# Patient Record
Sex: Male | Born: 1935 | Race: Black or African American | Hispanic: No | Marital: Married | Smoking: Former smoker
Health system: Southern US, Community
[De-identification: ages and names within clinical notes are randomized; demographics above are authoritative.]

## PROBLEM LIST (undated history)

## (undated) DIAGNOSIS — R519 Headache, unspecified: Secondary | ICD-10-CM

## (undated) DIAGNOSIS — G4730 Sleep apnea, unspecified: Secondary | ICD-10-CM

## (undated) DIAGNOSIS — T4145XA Adverse effect of unspecified anesthetic, initial encounter: Secondary | ICD-10-CM

## (undated) DIAGNOSIS — Z87442 Personal history of urinary calculi: Secondary | ICD-10-CM

## (undated) DIAGNOSIS — E119 Type 2 diabetes mellitus without complications: Secondary | ICD-10-CM

## (undated) DIAGNOSIS — M1990 Unspecified osteoarthritis, unspecified site: Secondary | ICD-10-CM

## (undated) DIAGNOSIS — N183 Chronic kidney disease, stage 3 unspecified: Secondary | ICD-10-CM

## (undated) DIAGNOSIS — T8859XA Other complications of anesthesia, initial encounter: Secondary | ICD-10-CM

## (undated) DIAGNOSIS — J189 Pneumonia, unspecified organism: Secondary | ICD-10-CM

## (undated) DIAGNOSIS — I10 Essential (primary) hypertension: Secondary | ICD-10-CM

## (undated) DIAGNOSIS — K219 Gastro-esophageal reflux disease without esophagitis: Secondary | ICD-10-CM

## (undated) HISTORY — DX: Essential (primary) hypertension: I10

## (undated) HISTORY — PX: EYE SURGERY: SHX253

## (undated) HISTORY — PX: DENTAL SURGERY: SHX609

## (undated) HISTORY — PX: TONSILLECTOMY: SUR1361

## (undated) HISTORY — DX: Type 2 diabetes mellitus without complications: E11.9

## (undated) HISTORY — PX: OTHER SURGICAL HISTORY: SHX169

---
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
## 1997-09-18 ENCOUNTER — Emergency Department (HOSPITAL_COMMUNITY): Admission: EM | Admit: 1997-09-18 | Discharge: 1997-09-18 | Payer: Self-pay | Admitting: Emergency Medicine

## 2001-03-04 ENCOUNTER — Ambulatory Visit (HOSPITAL_COMMUNITY): Admission: RE | Admit: 2001-03-04 | Discharge: 2001-03-04 | Payer: Self-pay | Admitting: Internal Medicine

## 2001-03-04 ENCOUNTER — Encounter: Payer: Self-pay | Admitting: Internal Medicine

## 2001-04-28 ENCOUNTER — Emergency Department (HOSPITAL_COMMUNITY): Admission: EM | Admit: 2001-04-28 | Discharge: 2001-04-28 | Payer: Self-pay | Admitting: Emergency Medicine

## 2001-04-28 ENCOUNTER — Encounter: Payer: Self-pay | Admitting: Emergency Medicine

## 2004-01-23 ENCOUNTER — Ambulatory Visit (HOSPITAL_COMMUNITY): Admission: RE | Admit: 2004-01-23 | Discharge: 2004-01-23 | Payer: Self-pay | Admitting: Gastroenterology

## 2009-11-20 ENCOUNTER — Ambulatory Visit (HOSPITAL_COMMUNITY): Admission: RE | Admit: 2009-11-20 | Discharge: 2009-11-20 | Payer: Self-pay | Admitting: Internal Medicine

## 2010-09-20 NOTE — Op Note (Signed)
NAMELOXLEY, MACLENNAN               ACCOUNT NO.:  [id number]   MEDICAL RECORD NO.:  [id number]          PATIENT TYPE:  AMB   LOCATION:  ENDO                         FACILITY:  [facility]   PHYSICIAN:  Tory Emerald. Benson Norway, MD    DATE OF BIRTH:  1935-06-03   DATE OF PROCEDURE:  01/23/2004  DATE OF DISCHARGE:                                 OPERATIVE REPORT   PROCEDURE:  Colonoscopy.   ENDOSCOPIST:  Tory Emerald. Benson Norway, M.D.   EQUIPMENT USED:  Adult Olympus colonoscope.   INDICATIONS FOR PROCEDURE:  Hematochezia.   PHYSICAL EXAMINATION:  CARDIOVASCULAR:  Regular rate and rhythm.  LUNGS:  Clear to auscultation bilaterally.  ABDOMEN:  Obese, soft, nontender and nondistended.   MEDICATIONS:  Fentanyl 75 mcg IV and Versed 7.5 mg IV.   CONSENT:  Informed consent was obtained from the patient after describing  the risks of bleeding, infection, perforation, medication reactions, a 10%  miss rate for small colon cancer and the risk of death; all of which are not  exclusive of any other complications that can occur.   DESCRIPTION OF PROCEDURE:  After the patient was placed in the left lateral  decubitus position and adequate sedation was achieved, a rectal examination  was performed and there was no evidence of any masses palpated or other  abnormalities.  The colonoscope was then inserted through the anus and  advanced to the cecum under direct visualization.  Photo documentation was  obtained of the cecum.  The prep was graded as being good and in certain  areas, if there were pools of fluid, this was aspirated to improve  visualization and washes as needed.  The terminal ileum was not able to be  intubated as there was marked looping of the colonoscope during this  procedure secondary to the patient's girth and length of colon.  Upon  withdrawal of the colonoscope, the ascending, transverse, descending and  sigmoid colons, there was no evidence of any masses, __________, erosions,  ulcerations,  polyps, or diverticula.  With inspection of the rectum, again  there were no abnormalities, however, on retroflexion the patient did  exhibit moderate external hemorrhoids.  No active bleeding noted at this  time.   PLAN:  1.  Repeat colonoscopy in 10 years.  2.  Maintain high fiber diet.     PDH/MEDQ  D:  01/23/2004  T:  01/24/2004  Job:  [id number]

## 2013-09-28 ENCOUNTER — Ambulatory Visit (HOSPITAL_BASED_OUTPATIENT_CLINIC_OR_DEPARTMENT_OTHER): Payer: Medicare HMO | Attending: Internal Medicine | Admitting: Radiology

## 2013-09-28 VITALS — Ht 71.0 in | Wt 246.0 lb

## 2013-09-28 DIAGNOSIS — G4733 Obstructive sleep apnea (adult) (pediatric): Secondary | ICD-10-CM | POA: Insufficient documentation

## 2013-09-30 NOTE — Addendum Note (Signed)
Addended by: Mercer Pod on: 09/30/2013 06:55 AM   Modules accepted: Level of Service

## 2013-10-02 DIAGNOSIS — G4730 Sleep apnea, unspecified: Secondary | ICD-10-CM

## 2013-10-02 DIAGNOSIS — G4710 Hypersomnia, unspecified: Secondary | ICD-10-CM

## 2013-10-02 NOTE — Sleep Study (Signed)
   NAME: Brandon Weaver DATE OF BIRTH:  11/09/1935 MEDICAL RECORD NUMBER [id number]  LOCATION: [facility]  PHYSICIAN: Devina Bezold D Vijay Durflinger  DATE OF STUDY: 09/28/2013  SLEEP STUDY TYPE: Nocturnal Polysomnogram               REFERRING PHYSICIAN: Foye Spurling, MD  INDICATION FOR STUDY: Hypersomnia with sleep apnea  EPWORTH SLEEPINESS SCORE:   9/24 HEIGHT: 5\' 11"  (180.3 cm)  WEIGHT: 246 lb (111.585 kg)    Body mass index is 34.33 kg/(m^2).  NECK SIZE: 19.5 in.  MEDICATIONS: Charted for review  SLEEP ARCHITECTURE: Total sleep time 246.5 minutes with sleep efficiency 69.5%. Stage I was 14%, stage II 70.2%, stage III absent, REM 15.8% of total sleep time. Sleep latency 81.5 minutes, REM latency 89.5 minutes, awake after sleep onset 9.5 minutes, arousal index 24.3, bedtime medication: None  RESPIRATORY DATA: Apnea hypopnea index (AHI) 57 per hour. 233 total events scored including 127 obstructive apneas, 1 central apnea, 2 mixed apneas, 103 hypopneas. All events were while sleeping supine. REM AHI 81.5 per hour. He did not have enough sleep in the first hours of the night to meet on this study.  OXYGEN DATA: Moderate snoring with oxygen desaturation to a nadir of 76% and mean oxygen saturation through the study of 90.3% on room air.  CARDIAC DATA: Sinus rhythm with frequent PACs throughout the night  MOVEMENT/PARASOMNIA: No significant movement disturbance, no bathroom trips  IMPRESSION/ RECOMMENDATION:   1) Severe obstructive sleep apnea/hypopnea syndrome, AHI 57 per hour  with supine events. Moderate snoring with oxygen desaturation to a nadir of 76% and mean oxygen saturation through the study of 90.3% on room air.  2) Delayed sleep onset at 12:15 AM, too late to permit split protocol CPAP titration on this study. This patient can be scheduled for a dedicated CPAP titration study if appropriate.  Signed Baird Lyons M.D. Flasher, Tax adviser  of Sleep Medicine  ELECTRONICALLY SIGNED ON:  10/02/2013, 4:28 PM [place] PH: [phone]   FX: [phone] [place]

## 2013-12-25 ENCOUNTER — Ambulatory Visit (HOSPITAL_BASED_OUTPATIENT_CLINIC_OR_DEPARTMENT_OTHER): Payer: Medicare HMO | Attending: Internal Medicine | Admitting: Radiology

## 2013-12-25 VITALS — Ht 70.0 in | Wt 245.0 lb

## 2013-12-25 DIAGNOSIS — G4733 Obstructive sleep apnea (adult) (pediatric): Secondary | ICD-10-CM

## 2013-12-31 DIAGNOSIS — G4733 Obstructive sleep apnea (adult) (pediatric): Secondary | ICD-10-CM

## 2013-12-31 NOTE — Sleep Study (Signed)
   NAME: Brandon Weaver DATE OF BIRTH:  12-Dec-1935 MEDICAL RECORD NUMBER [id number]  LOCATION: [facility]  PHYSICIAN: Emmett Bracknell D  DATE OF STUDY: 12/25/2013  SLEEP STUDY TYPE: Nocturnal Polysomnogram               REFERRING PHYSICIAN: Foye Spurling, MD  INDICATION FOR STUDY: Hypersomnia with sleep apnea-CPAP titration  EPWORTH SLEEPINESS SCORE:   8/24 HEIGHT: 5\' 10"  (177.8 cm)  WEIGHT: 245 lb (111.131 kg)    Body mass index is 35.15 kg/(m^2).  NECK SIZE: 19.5 in.  MEDICATIONS: Charted for review  SLEEP ARCHITECTURE: Total sleep time 299.5 minutes with sleep efficiency 83.8%. Stage I was 2.2%, stage II 50.1%, stage III absent, REM 47.7% of total sleep time. Sleep latency 35 minutes, REM latency 5.5 minutes, awake after sleep onset 23 minutes, arousal index 7.6, bedtime medication: None  RESPIRATORY DATA: CPAP was titrated to 19 CWP, AHI 0 per hour. He wore a large fullface mask.  OXYGEN DATA: Snoring was prevented and mean oxygen saturation was 94.3% on room air with CPAP  CARDIAC DATA: Sinus rhythm with PACs  MOVEMENT/PARASOMNIA: No significant movement disturbance, no bathroom trips  IMPRESSION/ RECOMMENDATION:   1) Successful CPAP titration to 19 CWP, AHI 0 per hour. He wore a large [employer] fullface mask with heated humidifier. Snoring was prevented and mean oxygen saturation was 94.3% on room air. 2) baseline polysomnogram on 09/28/2013 recorded AHI 57 per hour with body weight 246 pounds for that study.   Deneise Lever Diplomate, American Board of Sleep Medicine  ELECTRONICALLY SIGNED ON:  12/31/2013, 9:51 AM [place] PH: [phone]   FX: [phone] [place]

## 2014-01-17 ENCOUNTER — Telehealth: Payer: Self-pay | Admitting: Internal Medicine

## 2014-01-17 NOTE — Telephone Encounter (Signed)
Spoke with Dr Rosezena Sensor was okay with patient seeing [facility] on 03-01-14 at 4pm for sleep consult. I gave Dr Carlis Abbott the phone number and address to the office to give the patient. He will also let patient know that he can call our office anytime to see if any sooner appts are available.

## 2014-01-20 ENCOUNTER — Encounter (HOSPITAL_BASED_OUTPATIENT_CLINIC_OR_DEPARTMENT_OTHER): Payer: Medicare Other

## 2014-03-01 ENCOUNTER — Ambulatory Visit (INDEPENDENT_AMBULATORY_CARE_PROVIDER_SITE_OTHER): Payer: Medicare HMO | Admitting: Pulmonary Disease

## 2014-03-01 ENCOUNTER — Encounter: Payer: Self-pay | Admitting: Pulmonary Disease

## 2014-03-01 VITALS — BP 140/72 | HR 73 | Temp 98.8°F | Ht 70.0 in | Wt 258.0 lb

## 2014-03-01 DIAGNOSIS — G4733 Obstructive sleep apnea (adult) (pediatric): Secondary | ICD-10-CM

## 2014-03-01 NOTE — Assessment & Plan Note (Signed)
The pt has severe obstructive sleep apnea, and will need aggressive treatment with cpap while he is working on weight loss.  I have had a long discussion with him about sleep apnea, including its impact to his quality of life and cardiovascular health. I will see him back in 8 weeks to check on his progress, and he has been encouraged to call us if he is having issues with tolerance. I will set the patient up on cpap at a moderate pressure level to allow for desensitization, and will troubleshoot the device over the next 4-6weeks if needed.  The pt is to call me if having issues with tolerance.  Will then optimize the pressure once patient is able to wear cpap on a consistent basis.

## 2014-03-01 NOTE — Patient Instructions (Signed)
Will start on cpap at a moderate pressure level.  Please call if having any tolerance issues. Work on weight loss followup with me in 8 weeks.

## 2014-03-01 NOTE — Progress Notes (Signed)
Subjective:    Patient ID: Brandon Weaver, male    DOB: [birth year], 78 y.o.   MRN: [id number]  HPI The patient is a 78 year old male who I have been asked to see for management of obstructive sleep apnea. He underwent a sleep study earlier this year and was found to have severe OSA, with an AHI of 57 events per hour. He returned for a C-peptide titration study, and was found to have an optimal pressure of 19 cm of water pressure. The patient has been told by his spouse that he has very loud snoring as well as an abnormal breathing pattern during sleep. He has frequent awakenings at night, and is not rested in the mornings upon arising. He will fall asleep with any period of inactivity during the day, also doze in the evening while watching television.  This will lead to difficulties establishing sleep at night because he had slept so much during the day. He denies any issues with sleepiness while driving. The patient states that his weight is been stable over the last 2 years, and his Epworth score today is 7.   Sleep Questionnaire What time do you typically go to bed?( Between what hours) 1:00- pt naps often throughout day when schedule permits.  1:00- pt naps often throughout day when schedule permits.  at 1558 on 03/01/14 by Len Blalock, CMA How long does it take you to fall asleep? 1-3 hours 1-3 hours at 1558 on 03/01/14 by Len Blalock, CMA How many times during the night do you wake up? 2 2 at 1558 on 03/01/14 by Len Blalock, CMA What time do you get out of bed to start your day? 0600 0600 at 1558 on 03/01/14 by Len Blalock, CMA Do you drive or operate heavy machinery in your occupation? No No at 1558 on 03/01/14 by Len Blalock, CMA How much has your weight changed (up or down) over the past two years? (In pounds) 0 oz (0 kg) 0 oz (0 kg) at 1558 on 03/01/14 by Len Blalock, CMA Have you ever had a sleep study before? Yes Yes at 1558 on  03/01/14 by Len Blalock, CMA If yes, location of study? [place] at 1558 on 03/01/14 by Len Blalock, CMA If yes, date of study? 2015 2015 at 1558 on 03/01/14 by Len Blalock, CMA Do you currently use CPAP? No No at 1558 on 03/01/14 by Len Blalock, CMA Do you wear oxygen at any time? No No at 1558 on 03/01/14 by Len Blalock, CMA   Review of Systems  Constitutional: Negative for fever and unexpected weight change.  HENT: Negative for congestion, dental problem, ear pain, nosebleeds, postnasal drip, rhinorrhea, sinus pressure, sneezing, sore throat and trouble swallowing.   Eyes: Negative for redness and itching.  Respiratory: Positive for apnea. Negative for cough, chest tightness, shortness of breath and wheezing.   Cardiovascular: Negative for palpitations and leg swelling.  Gastrointestinal: Negative for nausea and vomiting.  Genitourinary: Negative for dysuria.  Musculoskeletal: Negative for joint swelling.  Skin: Negative for rash.  Neurological: Negative for headaches.  Hematological: Does not bruise/bleed easily.  Psychiatric/Behavioral: Negative for dysphoric mood. The patient is not nervous/anxious.        Objective:   Physical Exam Constitutional:  Obese male, no acute distress  HENT:  Nares patent without discharge  Oropharynx without exudate, palate and uvula are very elongated and thick  Eyes:  Perrla, eomi,  no scleral icterus  Neck:  No JVD, no TMG  Cardiovascular:  Normal rate, regular rhythm, no rubs or gallops.  No murmurs        Intact distal pulses  Pulmonary :  Normal breath sounds, no stridor or respiratory distress   No rales, rhonchi, or wheezing  Abdominal:  Soft, nondistended, bowel sounds present.  No tenderness noted.   Musculoskeletal:  mild lower extremity edema noted.  Lymph Nodes:  No cervical lymphadenopathy noted  Skin:  No cyanosis noted  Neurologic:  Alert, appropriate, moves all  4 extremities without obvious deficit.         Assessment & Plan:

## 2014-04-26 ENCOUNTER — Ambulatory Visit (INDEPENDENT_AMBULATORY_CARE_PROVIDER_SITE_OTHER): Payer: Medicare HMO | Admitting: Pulmonary Disease

## 2014-04-26 ENCOUNTER — Encounter: Payer: Self-pay | Admitting: Pulmonary Disease

## 2014-04-26 VITALS — BP 134/76 | HR 76 | Temp 97.8°F | Ht 70.0 in | Wt 253.4 lb

## 2014-04-26 DIAGNOSIS — G4733 Obstructive sleep apnea (adult) (pediatric): Secondary | ICD-10-CM

## 2014-04-26 NOTE — Patient Instructions (Signed)
Please work with your home care company on a better mask fit if you continue to have leaks. Will make adjustments to your pressure.  Your home care company can do this wirelessly.  Work on weight loss followup with me in 73mos

## 2014-04-26 NOTE — Assessment & Plan Note (Signed)
The patient has been noncompliant with his C Pap since the last visit, and blames this on having a cold that he just got over. Dress to him the severity of his sleep apnea, and how it is potentially life-threatening over time.  I have asked him to get back on his device, and to let us know if he is having any issues with tolerance. He was having some mask leak issues, and I've asked him to contact his home care company to work on a better fitting mask. I will extend his pressure range on the automatic setting, and have encouraged him to work aggressively on weight loss.

## 2014-04-26 NOTE — Progress Notes (Signed)
   Subjective:    Patient ID: Brandon Weaver, male    DOB: Aug 31, 1935, 78 y.o.   MRN: [id number]  HPI The patient comes in today for follow-up of his obstructive sleep apnea. Started on CPAP at the last visit, but unfortunately has only worn his device 1 day since that time. He tells me that he developed a cold, and had a runny nose that interfered with using the C Pap device. He is now getting back on the device without issue. He is having some mask leak, but does not want to over tighten the straps. I have asked him to get back with his home care company to get this adjusted.   Review of Systems  Constitutional: Negative for fever and unexpected weight change.  HENT: Negative for congestion, dental problem, ear pain, nosebleeds, postnasal drip, rhinorrhea, sinus pressure, sneezing, sore throat and trouble swallowing.   Eyes: Negative for redness and itching.  Respiratory: Negative for cough, chest tightness, shortness of breath and wheezing.   Cardiovascular: Negative for palpitations and leg swelling.  Gastrointestinal: Negative for nausea and vomiting.  Genitourinary: Negative for dysuria.  Musculoskeletal: Negative for joint swelling.  Skin: Negative for rash.  Neurological: Negative for headaches.  Hematological: Does not bruise/bleed easily.  Psychiatric/Behavioral: Negative for dysphoric mood. The patient is not nervous/anxious.        Objective:   Physical Exam Overweight male in no acute distress Nose without purulence or discharge noted No skin breakdown or pressure necrosis from the C Pap mask Neck without lymphadenopathy or thyromegaly Lower extremities with mild edema, no cyanosis Alert and oriented, moves all 4 extremities.       Assessment & Plan:

## 2015-06-14 DIAGNOSIS — E119 Type 2 diabetes mellitus without complications: Secondary | ICD-10-CM | POA: Diagnosis not present

## 2015-06-14 DIAGNOSIS — I10 Essential (primary) hypertension: Secondary | ICD-10-CM | POA: Diagnosis not present

## 2015-06-14 DIAGNOSIS — E7800 Pure hypercholesterolemia, unspecified: Secondary | ICD-10-CM | POA: Diagnosis not present

## 2015-07-10 DIAGNOSIS — I10 Essential (primary) hypertension: Secondary | ICD-10-CM | POA: Diagnosis not present

## 2015-07-10 DIAGNOSIS — E119 Type 2 diabetes mellitus without complications: Secondary | ICD-10-CM | POA: Diagnosis not present

## 2015-07-10 DIAGNOSIS — E7800 Pure hypercholesterolemia, unspecified: Secondary | ICD-10-CM | POA: Diagnosis not present

## 2015-07-10 DIAGNOSIS — J40 Bronchitis, not specified as acute or chronic: Secondary | ICD-10-CM | POA: Diagnosis not present

## 2015-10-10 DIAGNOSIS — E1165 Type 2 diabetes mellitus with hyperglycemia: Secondary | ICD-10-CM | POA: Diagnosis not present

## 2015-10-10 DIAGNOSIS — E119 Type 2 diabetes mellitus without complications: Secondary | ICD-10-CM | POA: Diagnosis not present

## 2015-10-10 DIAGNOSIS — N400 Enlarged prostate without lower urinary tract symptoms: Secondary | ICD-10-CM | POA: Diagnosis not present

## 2015-10-10 DIAGNOSIS — E7800 Pure hypercholesterolemia, unspecified: Secondary | ICD-10-CM | POA: Diagnosis not present

## 2015-10-10 DIAGNOSIS — R945 Abnormal results of liver function studies: Secondary | ICD-10-CM | POA: Diagnosis not present

## 2015-10-10 DIAGNOSIS — I10 Essential (primary) hypertension: Secondary | ICD-10-CM | POA: Diagnosis not present

## 2015-10-10 DIAGNOSIS — E1122 Type 2 diabetes mellitus with diabetic chronic kidney disease: Secondary | ICD-10-CM | POA: Diagnosis not present

## 2015-10-31 ENCOUNTER — Encounter (HOSPITAL_COMMUNITY): Payer: Self-pay | Admitting: Emergency Medicine

## 2015-10-31 ENCOUNTER — Ambulatory Visit (HOSPITAL_COMMUNITY)
Admission: EM | Admit: 2015-10-31 | Discharge: 2015-10-31 | Disposition: A | Discharge status: Home / Self Care | Payer: Medicare Other | Attending: Family Medicine | Admitting: Family Medicine

## 2015-10-31 DIAGNOSIS — T63304A Toxic effect of unspecified spider venom, undetermined, initial encounter: Secondary | ICD-10-CM

## 2015-10-31 DIAGNOSIS — L03818 Cellulitis of other sites: Secondary | ICD-10-CM | POA: Diagnosis not present

## 2015-10-31 MED ORDER — DOXYCYCLINE HYCLATE 100 MG PO CAPS: 100.0000 mg | ORAL_CAPSULE | Freq: Two times a day (BID) | ORAL | Status: DC

## 2015-10-31 NOTE — ED Provider Notes (Signed)
CSN: [id number]     Arrival date & time 10/31/15  1418 History   First MD Initiated Contact with Patient 10/31/15 1521     Chief Complaint  Patient presents with  . Insect Bite   (Consider location/radiation/quality/duration/timing/severity/associated sxs/prior Treatment) Patient is a 80 y.o. male presenting with rash.  Rash Location:  Leg Leg rash location:  L lower leg and R lower leg Quality: redness   Onset quality:  Sudden Duration:  1 week Timing:  Constant Progression:  Spreading Context: insect bite/sting   Relieved by:  Nothing Worsened by:  Nothing tried   Past Medical History  Diagnosis Date  . Diabetes ([place])   . Hypertension    Past Surgical History  Procedure Laterality Date  . Reflux surgery    . L shoulder surgery      muscle repair  . Tonsillectomy     Family History  Problem Relation Age of Onset  . COPD Brother   . COPD Mother   . Heart disease Mother   . Sleep apnea Son   . Cancer - Prostate Brother    Social History  Substance Use Topics  . Smoking status: Former Smoker -- 0.50 packs/day for 30 years    Types: Cigarettes    Quit date: 05/05/1986  . Smokeless tobacco: Never Used  . Alcohol Use: None    Review of Systems  Constitutional: Negative.   HENT: Negative.   Eyes: Negative.   Respiratory: Negative.   Cardiovascular: Negative.   Gastrointestinal: Negative.   Endocrine: Negative.   Genitourinary: Negative.   Skin: Positive for rash.  Allergic/Immunologic: Negative.   Neurological: Negative.   Hematological: Negative.   Psychiatric/Behavioral: Negative.     Allergies  Review of patient's allergies indicates no known allergies.  Home Medications   Prior to Admission medications   Medication Sig Start Date End Date Taking? Authorizing Provider  amLODipine (NORVASC) 10 MG tablet Take 10 mg by mouth daily.   Yes Historical Provider, MD  insulin aspart protamine- aspart (NOVOLOG MIX 70/30) (70-30) 100 UNIT/ML injection  Inject 45 Units into the skin 2 (two) times daily with a meal.   Yes Historical Provider, MD  irbesartan (AVAPRO) 300 MG tablet Take 300 mg by mouth daily.   Yes Historical Provider, MD  doxycycline (VIBRAMYCIN) 100 MG capsule Take 1 capsule (100 mg total) by mouth 2 (two) times daily. 10/31/15   Lysbeth Penner, FNP   Meds Ordered and Administered this Visit  Medications - No data to display  BP 138/85 mmHg  Pulse 79  Temp(Src) 98.8 F (37.1 C) (Oral)  Resp 12  SpO2 95% No data found.   Physical Exam  Constitutional: He appears well-developed and well-nourished.  HENT:  Head: Normocephalic.  Right Ear: External ear normal.  Left Ear: External ear normal.  Mouth/Throat: Oropharynx is clear and moist.  Eyes: Conjunctivae are normal. Pupils are equal, round, and reactive to light.  Neck: Normal range of motion. Neck supple.  Cardiovascular: Normal rate, regular rhythm and normal heart sounds.   Pulmonary/Chest: Effort normal and breath sounds normal.  Skin: Rash noted.  Left lower leg with erythema and swelling with central puncture approx 4cm and right  Leg with erythema and swelling and central puncture wound with no drainage approx 3 cm diameter.    ED Course  Procedures (including critical care time)  Labs Review Labs Reviewed - No data to display  Imaging Review No results found.   Visual Acuity Review  Right Eye  Distance:   Left Eye Distance:   Bilateral Distance:    Right Eye Near:   Left Eye Near:    Bilateral Near:         MDM   1. Spider bite, undetermined intent, initial encounter   2. Cellulitis of other specified site    Doxycycline 100mg  one po bid x 10 days #20     Lysbeth Penner, FNP 10/31/15 1635

## 2015-10-31 NOTE — Discharge Instructions (Signed)
Take Benadryl 25mg  one to two po q 6 hours prn itching or rash.  May cause sleepiness or somnolence so may want to take at night time during hour of sleep.  Follow up if not better prn

## 2015-10-31 NOTE — ED Notes (Signed)
The patient presented to the [facility] with a complaint of a possible insect bite on his lower left leg that occurred a week ago. The patient stated that it is now red and swollen.

## 2015-11-08 DIAGNOSIS — Z0001 Encounter for general adult medical examination with abnormal findings: Secondary | ICD-10-CM | POA: Diagnosis not present

## 2015-12-27 DIAGNOSIS — H40023 Open angle with borderline findings, high risk, bilateral: Secondary | ICD-10-CM | POA: Diagnosis not present

## 2015-12-27 DIAGNOSIS — E119 Type 2 diabetes mellitus without complications: Secondary | ICD-10-CM | POA: Diagnosis not present

## 2015-12-27 DIAGNOSIS — H04123 Dry eye syndrome of bilateral lacrimal glands: Secondary | ICD-10-CM | POA: Diagnosis not present

## 2015-12-27 DIAGNOSIS — H40033 Anatomical narrow angle, bilateral: Secondary | ICD-10-CM | POA: Diagnosis not present

## 2016-02-13 DIAGNOSIS — N400 Enlarged prostate without lower urinary tract symptoms: Secondary | ICD-10-CM | POA: Diagnosis not present

## 2016-02-13 DIAGNOSIS — E7800 Pure hypercholesterolemia, unspecified: Secondary | ICD-10-CM | POA: Diagnosis not present

## 2016-02-13 DIAGNOSIS — I10 Essential (primary) hypertension: Secondary | ICD-10-CM | POA: Diagnosis not present

## 2016-02-13 DIAGNOSIS — E1165 Type 2 diabetes mellitus with hyperglycemia: Secondary | ICD-10-CM | POA: Diagnosis not present

## 2016-02-20 DIAGNOSIS — R350 Frequency of micturition: Secondary | ICD-10-CM | POA: Diagnosis not present

## 2016-02-20 DIAGNOSIS — R972 Elevated prostate specific antigen [PSA]: Secondary | ICD-10-CM | POA: Diagnosis not present

## 2016-08-05 ENCOUNTER — Ambulatory Visit (HOSPITAL_COMMUNITY)
Admission: RE | Admit: 2016-08-05 | Discharge: 2016-08-05 | Disposition: A | Discharge status: Home / Self Care | Payer: Medicare Other | Source: Ambulatory Visit | Attending: Internal Medicine | Admitting: Internal Medicine

## 2016-08-05 ENCOUNTER — Other Ambulatory Visit: Payer: Self-pay | Admitting: Internal Medicine

## 2016-08-05 DIAGNOSIS — R042 Hemoptysis: Secondary | ICD-10-CM | POA: Insufficient documentation

## 2016-08-05 DIAGNOSIS — I700 Atherosclerosis of aorta: Secondary | ICD-10-CM | POA: Diagnosis not present

## 2017-01-12 ENCOUNTER — Ambulatory Visit (HOSPITAL_COMMUNITY)
Admission: EM | Admit: 2017-01-12 | Discharge: 2017-01-12 | Disposition: A | Discharge status: Home / Self Care | Payer: Medicare Other | Attending: Family Medicine | Admitting: Family Medicine

## 2017-01-12 ENCOUNTER — Encounter (HOSPITAL_COMMUNITY): Payer: Self-pay | Admitting: Emergency Medicine

## 2017-01-12 DIAGNOSIS — M5489 Other dorsalgia: Secondary | ICD-10-CM

## 2017-01-12 DIAGNOSIS — M546 Pain in thoracic spine: Secondary | ICD-10-CM

## 2017-01-12 DIAGNOSIS — R6883 Chills (without fever): Secondary | ICD-10-CM | POA: Diagnosis not present

## 2017-01-12 NOTE — Discharge Instructions (Signed)
It was nice meeting you today Mr. Ohalloran!  Your chills were likely due to a virus. Antibiotics do not treat viruses, and they go away on their own.   Your body aches and muscle pains are likely due to overexertion after helping the man out of the chair. You can take ibuprofen or Tylenol as needed for these pains.   If your cough gets worse, please let your primary doctor know.   Be well,  Dr. Avon Gully

## 2017-01-12 NOTE — ED Triage Notes (Signed)
PT reports he couldn't get out of bed Friday. PT reports he had bodyaches, chills, and pain all over.

## 2017-01-12 NOTE — ED Provider Notes (Signed)
Brandon Weaver    CSN: [id number] Arrival date & time: 01/12/17  1020   History   Chief Complaint Chief Complaint  Patient presents with  . Influenza    HPI SENDER RUEB is a 81 y.o. male.   HPI  Patient presents with body aches and chills.  Both symptoms began Friday morning. Said his body hurt so badly that he was unable to get out of bed. Pain located in back below L scapula and also in L groin. The day before, he tried to help lift a man out of a chair, and wonders if the pain may be related to that. He has not taken any medication to help with the pain. Soreness has improved since then but is still present.  Developed chills on Friday morning as well. Alternated between feeling very cold then very hot. Does not think he had a fever but has not measured his temperature. Denies sore throat, nasal congestion, vomiting, diarrhea. Has had normal appetite. No sick contacts. Denies SOB or chest pain. Has had a productive cough for a few months now but has not worsened recently.    Past Medical History:  Diagnosis Date  . Diabetes ([place])   . Hypertension     Patient Active Problem List   Diagnosis Date Noted  . OSA (obstructive sleep apnea) 03/01/2014    Past Surgical History:  Procedure Laterality Date  . L shoulder surgery     muscle repair  . reflux surgery    . TONSILLECTOMY        Home Medications    Prior to Admission medications   Medication Sig Start Date End Date Taking? Authorizing Provider  amLODipine (NORVASC) 10 MG tablet Take 10 mg by mouth daily.    [provider]  doxycycline (VIBRAMYCIN) 100 MG capsule Take 1 capsule (100 mg total) by mouth 2 (two) times daily. 10/31/15   Lysbeth Penner, FNP  insulin aspart protamine- aspart (NOVOLOG MIX 70/30) (70-30) 100 UNIT/ML injection Inject 45 Units into the skin 2 (two) times daily with a meal.    [provider]  irbesartan (AVAPRO) 300 MG tablet Take 300 mg by mouth daily.     [provider]    Family History Family History  Problem Relation Age of Onset  . COPD Brother   . COPD Mother   . Heart disease Mother   . Sleep apnea Son   . Cancer - Prostate Brother     Social History Social History  Substance Use Topics  . Smoking status: Former Smoker    Packs/day: 0.50    Years: 30.00    Types: Cigarettes    Quit date: 05/05/1986  . Smokeless tobacco: Never Used  . Alcohol use Not on file     Allergies   Patient has no known allergies.   Review of Systems Review of Systems  Constitutional: Positive for chills. Negative for appetite change and fever.  HENT: Negative for congestion, sneezing and sore throat.   Respiratory: Positive for cough. Negative for shortness of breath and wheezing.   Cardiovascular: Negative for chest pain.  Gastrointestinal: Negative for diarrhea, nausea and vomiting.  Musculoskeletal: Positive for back pain (Under L scapula).    Physical Exam Triage Vital Signs ED Triage Vitals  Enc Vitals Group     BP 01/12/17 1136 (!) 147/78     Pulse Rate 01/12/17 1136 91     Resp 01/12/17 1136 16     Temp 01/12/17 1136 98.9  F (37.2 C)     Temp Source 01/12/17 1136 Oral     SpO2 01/12/17 1136 95 %     Weight 01/12/17 1137 250 lb (113.4 kg)     Height 01/12/17 1137 5\' 10"  (1.778 m)     Head Circumference --      Peak Flow --      Pain Score 01/12/17 1137 7     Pain Loc --      Pain Edu? --      Excl. in [place]? --    No data found.   Updated Vital Signs BP (!) 147/78   Pulse 91   Temp 98.9 F (37.2 C) (Oral)   Resp 16   Ht 5\' 10"  (1.778 m)   Wt 250 lb (113.4 kg)   SpO2 95%   BMI 35.87 kg/m   Physical Exam  Constitutional: He is oriented to person, place, and time. He appears well-developed and well-nourished. No distress.  HENT:  Head: Normocephalic and atraumatic.  Nose: Nose normal.  Mouth/Throat: Oropharynx is clear and moist. No oropharyngeal exudate.  Eyes: Pupils are equal, round, and reactive  to light. Conjunctivae and EOM are normal. Right eye exhibits no discharge. Left eye exhibits no discharge.  Neck: Normal range of motion. Neck supple.  Cardiovascular: Normal rate, regular rhythm and normal heart sounds.   No murmur heard. Pulmonary/Chest: Effort normal and breath sounds normal. No respiratory distress. He has no wheezes.  Abdominal: Soft. Bowel sounds are normal. He exhibits no distension. There is no tenderness.  Musculoskeletal:  No TTP of back, L flank, L abdominal quadrants, or groin area, however patient can pinpoint location of pain in these specific areas.   Neurological: He is alert and oriented to person, place, and time.  Skin: Skin is warm and dry.  Psychiatric: He has a normal mood and affect. His behavior is normal.    UC Treatments / Results  Labs (all labs ordered are listed, but only abnormal results are displayed) Labs Reviewed - No data to display  EKG  EKG Interpretation None       Radiology No results found.  Procedures Procedures (including critical care time)  Medications Ordered in UC Medications - No data to display   Initial Impression / Assessment and Plan / UC Course  I have reviewed the triage vital signs and the nursing notes.  Pertinent labs & imaging results that were available during my care of the patient were reviewed by me and considered in my medical decision making (see chart for details).     Patient presenting with back and groin pain as well as chills. Suspect back and groin pain likely MSK in etiology related to helping large man out of a chair. Patient does not typically lift heavy things, and likely over exerted himself. Suspect will improve with ibuprofen or Tylenol, and encouraged patient to begin taking this as needed. Has already started to improve without medication at all. Etiology of chills less clear, however likely viral. No other accompanying symptoms of typical viral illness, including no URI symptoms  and no diarrhea or vomiting. As has now resolved, will not proceed with further workup. Patient does have history of chronic productive cough, however no coughing during encounter today and lungs CTAB. Also denies recent worsening of symptoms and denies SOB. Encouraged to f/u with his PCP if cough worsen.   Final Clinical Impressions(s) / UC Diagnoses   Final diagnoses:  Chills (without fever)  Acute left-sided thoracic back  pain    New Prescriptions New Prescriptions   No medications on file   Adin Hector, MD, MPH PGY-3     Verner Mould, MD 01/12/17 1201

## 2017-01-17 ENCOUNTER — Encounter (HOSPITAL_COMMUNITY): Payer: Self-pay | Admitting: Emergency Medicine

## 2017-01-17 ENCOUNTER — Emergency Department (HOSPITAL_COMMUNITY)
Admission: EM | Admit: 2017-01-17 | Discharge: 2017-01-17 | Disposition: A | Discharge status: Home / Self Care | Payer: Medicare Other | Attending: Emergency Medicine | Admitting: Emergency Medicine

## 2017-01-17 ENCOUNTER — Emergency Department (HOSPITAL_COMMUNITY): Payer: Medicare Other

## 2017-01-17 DIAGNOSIS — Z87891 Personal history of nicotine dependence: Secondary | ICD-10-CM | POA: Diagnosis not present

## 2017-01-17 DIAGNOSIS — M79605 Pain in left leg: Secondary | ICD-10-CM

## 2017-01-17 DIAGNOSIS — M545 Low back pain: Secondary | ICD-10-CM | POA: Diagnosis not present

## 2017-01-17 DIAGNOSIS — I10 Essential (primary) hypertension: Secondary | ICD-10-CM | POA: Diagnosis not present

## 2017-01-17 DIAGNOSIS — Z794 Long term (current) use of insulin: Secondary | ICD-10-CM | POA: Insufficient documentation

## 2017-01-17 DIAGNOSIS — E119 Type 2 diabetes mellitus without complications: Secondary | ICD-10-CM | POA: Insufficient documentation

## 2017-01-17 DIAGNOSIS — Z79899 Other long term (current) drug therapy: Secondary | ICD-10-CM | POA: Insufficient documentation

## 2017-01-17 MED ORDER — NAPROXEN 500 MG PO TABS: 500.0000 mg | ORAL_TABLET | Freq: Two times a day (BID) | ORAL | 0 refills | Status: DC

## 2017-01-17 MED ORDER — METHOCARBAMOL 500 MG PO TABS
500.0000 mg | ORAL_TABLET | Freq: Once | ORAL | Status: AC
  Administered 2017-01-17: 500 mg via ORAL
  Filled 2017-01-17: qty 1

## 2017-01-17 MED ORDER — NAPROXEN 250 MG PO TABS
500.0000 mg | ORAL_TABLET | Freq: Once | ORAL | Status: AC
  Administered 2017-01-17: 500 mg via ORAL
  Filled 2017-01-17: qty 2

## 2017-01-17 MED ORDER — METHOCARBAMOL 500 MG PO TABS: 500.0000 mg | ORAL_TABLET | Freq: Every evening | ORAL | 0 refills | Status: DC | PRN

## 2017-01-17 NOTE — ED Provider Notes (Signed)
[facility] DEPT Provider Note   CSN: [id number] Arrival date & time: 01/17/17  1045     History   Chief Complaint Chief Complaint  Patient presents with  . Back Pain  . Knee Pain    HPI Brandon Weaver is a 81 y.o. male who presents with left lower back pain and left leg pain. PMH significant for Type 2 DM, HTN, obesity. He states that about 9 days ago he was at his doctor's office and helped a 300+ pound man get out of a chair. He immediately felt pain in his back. The next day he also had chills and sweats so thought that he may have pneumonia or the flu. He went to [facility] and they diagnosed him with a muscle strain. Since then he has had persistent low back pain and improving left thoracic back pain. He also has pain in his left leg down to the knee and sometimes to the ankle and foot which are swollen. No fever, syncope, loss of bowel/bladder function, saddle anesthesia, urinary retention. He has been able to walk but is in a lot of pain. He has been taking Ibuprofen with minimal relief.    HPI  Past Medical History:  Diagnosis Date  . Diabetes ([place])   . Hypertension     Patient Active Problem List   Diagnosis Date Noted  . OSA (obstructive sleep apnea) 03/01/2014    Past Surgical History:  Procedure Laterality Date  . L shoulder surgery     muscle repair  . reflux surgery    . TONSILLECTOMY         Home Medications    Prior to Admission medications   Medication Sig Start Date End Date Taking? Authorizing Provider  amLODipine (NORVASC) 10 MG tablet Take 10 mg by mouth daily.    [provider]  doxycycline (VIBRAMYCIN) 100 MG capsule Take 1 capsule (100 mg total) by mouth 2 (two) times daily. 10/31/15   Lysbeth Penner, FNP  insulin aspart protamine- aspart (NOVOLOG MIX 70/30) (70-30) 100 UNIT/ML injection Inject 45 Units into the skin 2 (two) times daily with a meal.    [provider]  irbesartan (AVAPRO) 300 MG tablet Take 300 mg by mouth  daily.    [provider]    Family History Family History  Problem Relation Age of Onset  . COPD Brother   . COPD Mother   . Heart disease Mother   . Sleep apnea Son   . Cancer - Prostate Brother     Social History Social History  Substance Use Topics  . Smoking status: Former Smoker    Packs/day: 0.50    Years: 30.00    Types: Cigarettes    Quit date: 05/05/1986  . Smokeless tobacco: Never Used  . Alcohol use Not on file     Allergies   Patient has no known allergies.   Review of Systems Review of Systems  Constitutional: Negative for fever.  Musculoskeletal: Positive for back pain, gait problem, joint swelling and myalgias. Negative for arthralgias and neck pain.  Skin: Negative for wound.  Neurological: Negative for weakness and numbness.     Physical Exam Updated Vital Signs BP 130/73   Pulse 69   Temp 97.8 F (36.6 C)   Resp 18   SpO2 96%   Physical Exam  Constitutional: He is oriented to person, place, and time. He appears well-developed and well-nourished. No distress.  Overweight male in NAD  HENT:  Head: Normocephalic and  atraumatic.  Eyes: Pupils are equal, round, and reactive to light. Conjunctivae are normal. Right eye exhibits no discharge. Left eye exhibits no discharge. No scleral icterus.  Neck: Normal range of motion.  Cardiovascular: Normal rate.   Pulmonary/Chest: Effort normal. No respiratory distress.  Abdominal: He exhibits no distension.  Musculoskeletal:  Back: Inspection: No masses, deformity, or rash Palpation: Lumbar midline spinal tenderness. No paraspinal muscle tenderness. Pain is elicited with ROM Strength: 5/5 in lower extremities and normal plantar and dorsiflexion Sensation: Intact sensation with light touch in lower extremities bilaterally SLR: Positive seated straight leg raise on the left Gait: Antalgic gait   Neurological: He is alert and oriented to person, place, and time.  Skin: Skin is warm and dry.    Psychiatric: He has a normal mood and affect. His behavior is normal.  Nursing note and vitals reviewed.    ED Treatments / Results  Labs (all labs ordered are listed, but only abnormal results are displayed) Labs Reviewed - No data to display  EKG  EKG Interpretation None       Radiology Dg Lumbar Spine Complete  Result Date: 01/17/2017 CLINICAL DATA:  Injured back while lifting. EXAM: LUMBAR SPINE - COMPLETE 4+ VIEW COMPARISON:  None. FINDINGS: There are 5 non rib-bearing lumbar type vertebral bodies. Normal alignment of lumbar spine. No anterolisthesis or retrolisthesis. No definite pars defects. Lumbar vertebral body heights are preserved. Mild to moderate multilevel lumbar spine DDD, potentially worse at L2-L3 with disc space height loss, endplate irregularity and sclerosis Limited visualization of the bilateral SI joints and hips is normal. Calcified atherosclerotic plaque within the abdominal aorta. There is an approximately 1.8 x 0.9 cm opacity which overlies the expected location of the right renal fossa. IMPRESSION: 1. No acute findings. 2. Mild multilevel lumbar spine DDD. 3. Approximately 1.8 cm opacity overlies expected location of the right renal fossa. While potentially representative of radiopaque debris within the overlying colon, a renal stone could have a similar appearance. Clinical correlation is advised. Further evaluation with renal ultrasound could be performed as indicated. 4.  Aortic Atherosclerosis (ICD10-I70.0). Electronically Signed   By: Sandi Mariscal M.D.   On: 01/17/2017 13:56    Procedures Procedures (including critical care time)  Medications Ordered in ED Medications  naproxen (NAPROSYN) tablet 500 mg (500 mg Oral Given 01/17/17 1341)  methocarbamol (ROBAXIN) tablet 500 mg (500 mg Oral Given 01/17/17 1341)     Initial Impression / Assessment and Plan / ED Course  I have reviewed the triage vital signs and the nursing notes.  Pertinent labs &  imaging results that were available during my care of the patient were reviewed by me and considered in my medical decision making (see chart for details).  81 year old male presents with left sided back pain and left leg pain after heavy lifting over a week ago. No signs/symptoms of Cauda Equina. He was given pain medicine in the ED and which provided good relief. Xrays are remarkable for degenerative changes. Xray does comment on possible renal stone - pt denies any urinary symptoms. Will give rx for Naproxen and Robaxin and have him f/u with his doctor if back pain persists. Return precautions were given. Discussed with Dr. Rex Kras who is in agreement with plan.  Final Clinical Impressions(s) / ED Diagnoses   Final diagnoses:  Acute left-sided low back pain, with sciatica presence unspecified  Left leg pain    New Prescriptions New Prescriptions   No medications on file  Recardo Evangelist, PA-C 01/17/17 1453    Little, Wenda Overland, MD 01/17/17 2132

## 2017-01-17 NOTE — Discharge Instructions (Signed)
Take Naproxen twice a day for the next week. Take this medicine with food. Take muscle relaxer at bedtime to help you sleep. This medicine makes you drowsy so do not take before driving or work Use a heating pad for sore muscles - use for 20 minutes several times a day Return for worsening symptoms

## 2017-01-17 NOTE — ED Triage Notes (Addendum)
Pt seen 9/10. Pt states last Thursday he helped a man at the doctors office get out of his chair, 300lbs or so. Pt states since then his left knee has hurt, his back hurts all over. Pt thinks he strained a muscle and possible tore something in his knee. Evaluated for this at urgent care 9/10. Afebrile. Denies loss of bladder or bowels.

## 2017-01-24 ENCOUNTER — Emergency Department (HOSPITAL_COMMUNITY): Payer: Medicare Other

## 2017-01-24 ENCOUNTER — Encounter (HOSPITAL_COMMUNITY): Payer: Self-pay

## 2017-01-24 ENCOUNTER — Emergency Department (HOSPITAL_COMMUNITY)
Admission: EM | Admit: 2017-01-24 | Discharge: 2017-01-25 | Disposition: A | Discharge status: Home / Self Care | Payer: Medicare Other | Attending: Emergency Medicine | Admitting: Emergency Medicine

## 2017-01-24 DIAGNOSIS — E278 Other specified disorders of adrenal gland: Secondary | ICD-10-CM

## 2017-01-24 DIAGNOSIS — N400 Enlarged prostate without lower urinary tract symptoms: Secondary | ICD-10-CM

## 2017-01-24 DIAGNOSIS — F17210 Nicotine dependence, cigarettes, uncomplicated: Secondary | ICD-10-CM | POA: Insufficient documentation

## 2017-01-24 DIAGNOSIS — I10 Essential (primary) hypertension: Secondary | ICD-10-CM | POA: Insufficient documentation

## 2017-01-24 DIAGNOSIS — R51 Headache: Secondary | ICD-10-CM | POA: Diagnosis not present

## 2017-01-24 DIAGNOSIS — M545 Low back pain, unspecified: Secondary | ICD-10-CM

## 2017-01-24 DIAGNOSIS — R509 Fever, unspecified: Secondary | ICD-10-CM | POA: Diagnosis not present

## 2017-01-24 DIAGNOSIS — Z794 Long term (current) use of insulin: Secondary | ICD-10-CM | POA: Diagnosis not present

## 2017-01-24 DIAGNOSIS — M5136 Other intervertebral disc degeneration, lumbar region: Secondary | ICD-10-CM

## 2017-01-24 DIAGNOSIS — Z79899 Other long term (current) drug therapy: Secondary | ICD-10-CM | POA: Diagnosis not present

## 2017-01-24 DIAGNOSIS — E119 Type 2 diabetes mellitus without complications: Secondary | ICD-10-CM | POA: Diagnosis not present

## 2017-01-24 DIAGNOSIS — N200 Calculus of kidney: Secondary | ICD-10-CM

## 2017-01-24 LAB — COMPREHENSIVE METABOLIC PANEL
ALK PHOS: 64 U/L (ref 38–126)
ALT: 36 U/L (ref 17–63)
AST: 26 U/L (ref 15–41)
Albumin: 3.1 g/dL — ABNORMAL LOW (ref 3.5–5.0)
Anion gap: 8 (ref 5–15)
BUN: 25 mg/dL — ABNORMAL HIGH (ref 6–20)
CALCIUM: 9 mg/dL (ref 8.9–10.3)
CO2: 23 mmol/L (ref 22–32)
CREATININE: 1.65 mg/dL — AB (ref 0.61–1.24)
Chloride: 104 mmol/L (ref 101–111)
GFR, EST AFRICAN AMERICAN: 44 mL/min — AB (ref >60)
GFR, EST NON AFRICAN AMERICAN: 38 mL/min — AB (ref >60)
Glucose, Bld: 112 mg/dL — ABNORMAL HIGH (ref 65–99)
Potassium: 4.6 mmol/L (ref 3.5–5.1)
Sodium: 135 mmol/L (ref 135–145)
TOTAL PROTEIN: 7.5 g/dL (ref 6.5–8.1)
Total Bilirubin: 0.5 mg/dL (ref 0.3–1.2)

## 2017-01-24 LAB — CBC WITH DIFFERENTIAL/PLATELET
Basophils Absolute: 0 10*3/uL (ref 0.0–0.1)
Basophils Relative: 0 %
EOS PCT: 1 %
Eosinophils Absolute: 0.1 10*3/uL (ref 0.0–0.7)
HCT: 39.9 % (ref 39.0–52.0)
HEMOGLOBIN: 13.1 g/dL (ref 13.0–17.0)
LYMPHS ABS: 1.1 10*3/uL (ref 0.7–4.0)
LYMPHS PCT: 14 %
MCH: 28 pg (ref 26.0–34.0)
MCHC: 32.8 g/dL (ref 30.0–36.0)
MCV: 85.3 fL (ref 78.0–100.0)
Monocytes Absolute: 0.9 10*3/uL (ref 0.1–1.0)
Monocytes Relative: 11 %
Neutro Abs: 6.2 10*3/uL (ref 1.7–7.7)
Neutrophils Relative %: 74 %
PLATELETS: 350 10*3/uL (ref 150–400)
RBC: 4.68 MIL/uL (ref 4.22–5.81)
RDW: 13.4 % (ref 11.5–15.5)
WBC: 8.3 10*3/uL (ref 4.0–10.5)

## 2017-01-24 LAB — URINALYSIS, ROUTINE W REFLEX MICROSCOPIC
BILIRUBIN URINE: NEGATIVE
Bacteria, UA: NONE SEEN
GLUCOSE, UA: NEGATIVE mg/dL
KETONES UR: NEGATIVE mg/dL
Leukocytes, UA: NEGATIVE
NITRITE: NEGATIVE
SPECIFIC GRAVITY, URINE: 1.017 (ref 1.005–1.030)
pH: 5 (ref 5.0–8.0)

## 2017-01-24 LAB — I-STAT CG4 LACTIC ACID, ED: LACTIC ACID, VENOUS: 1.28 mmol/L (ref 0.5–1.9)

## 2017-01-24 MED ORDER — SODIUM CHLORIDE 0.9 % IV BOLUS (SEPSIS)
1000.0000 mL | Freq: Once | INTRAVENOUS | Status: AC
  Administered 2017-01-24: 1000 mL via INTRAVENOUS

## 2017-01-24 MED ORDER — PIPERACILLIN-TAZOBACTAM 3.375 G IVPB 30 MIN
3.3750 g | Freq: Once | INTRAVENOUS | Status: AC
  Administered 2017-01-24: 3.375 g via INTRAVENOUS
  Filled 2017-01-24: qty 50

## 2017-01-24 MED ORDER — ONDANSETRON HCL 4 MG/2ML IJ SOLN
4.0000 mg | Freq: Once | INTRAMUSCULAR | Status: AC
  Administered 2017-01-24: 4 mg via INTRAVENOUS
  Filled 2017-01-24: qty 2

## 2017-01-24 MED ORDER — MORPHINE SULFATE (PF) 4 MG/ML IV SOLN
4.0000 mg | INTRAVENOUS | Status: DC | PRN
  Administered 2017-01-24 – 2017-01-25 (×2): 4 mg via INTRAVENOUS
  Filled 2017-01-24 (×3): qty 1

## 2017-01-24 MED ORDER — VANCOMYCIN HCL 10 G IV SOLR
2000.0000 mg | Freq: Once | INTRAVENOUS | Status: AC
  Administered 2017-01-24: 2000 mg via INTRAVENOUS
  Filled 2017-01-24: qty 2000

## 2017-01-24 MED ORDER — ACETAMINOPHEN 325 MG PO TABS
650.0000 mg | ORAL_TABLET | Freq: Once | ORAL | Status: AC
  Administered 2017-01-24: 650 mg via ORAL
  Filled 2017-01-24: qty 2

## 2017-01-24 MED ORDER — METHOCARBAMOL 1000 MG/10ML IJ SOLN
1000.0000 mg | Freq: Once | INTRAVENOUS | Status: AC
  Administered 2017-01-24: 1000 mg via INTRAVENOUS
  Filled 2017-01-24: qty 10

## 2017-01-24 MED ORDER — MORPHINE SULFATE (PF) 4 MG/ML IV SOLN
4.0000 mg | INTRAVENOUS | Status: DC | PRN
  Administered 2017-01-24: 4 mg via INTRAVENOUS

## 2017-01-24 NOTE — ED Triage Notes (Signed)
Pt was helping lift someone a week ago and pulled back.  Seen by ER and given meds.  Not helping.  Pt also c/o headache x 3 weeks.  Pt c/o swelling in feet which coordinates with the leg pain/back pain.

## 2017-01-24 NOTE — ED Notes (Signed)
Pt in MRI.

## 2017-01-24 NOTE — ED Provider Notes (Signed)
[facility] DEPT Provider Note   CSN: [id number] Arrival date & time: 01/24/17  1056     History   Chief Complaint Chief Complaint  Patient presents with  . Back Pain    HPI HEMI CHACKO is a 81 y.o. male. Complaint is chills, and back pain.  HPI:  80 year old male. He has no history of chronic back pain syndromes. Does have history of hypertension, on Norvasc, and Avapro. Diabetes on insulin. Has had back pain for the last 16 days.  Patient states that on Thursday, 16 days ago he was at a physician's office. He was helping to get another patient of a chair. He states that he was pulling with his arms and states the gentleman was over 300 pounds. He felt "like my arms were going to come off". He felt some discomfort in his back. He functioned well the rest of that day. The following day, Friday states he could barely move. Was seen at urgent care on Saturday. Had a negative x-ray and was told he didn't have "pneumonia or the flu".  Also on the same Friday he developed chills. He states he's had persisted every day. He now has a headache every day. Continued back pain. Was seen in the ER. Had reassuring back films was given naproxen, and Robaxin. He states these helped a bit. His pain is worsening. It is not leg pain. He does not have weakness in his legs. He has not had a bowel movement in 5 days but reports no incontinence. He is urinating without difficulty or symptoms. His pain is primarily left-sided back but is now bilateral and midline low back.  No history of cancer.  Past Medical History:  Diagnosis Date  . Diabetes ([place])   . Hypertension     Patient Active Problem List   Diagnosis Date Noted  . OSA (obstructive sleep apnea) 03/01/2014    Past Surgical History:  Procedure Laterality Date  . L shoulder surgery     muscle repair  . reflux surgery    . TONSILLECTOMY         Home Medications    Prior to Admission medications   Medication Sig Start Date End  Date Taking? Authorizing Provider  amLODipine (NORVASC) 10 MG tablet Take 10 mg by mouth daily.    [provider]  doxycycline (VIBRAMYCIN) 100 MG capsule Take 1 capsule (100 mg total) by mouth 2 (two) times daily. 10/31/15   Lysbeth Penner, FNP  insulin aspart protamine- aspart (NOVOLOG MIX 70/30) (70-30) 100 UNIT/ML injection Inject 45 Units into the skin 2 (two) times daily with a meal.    [provider]  irbesartan (AVAPRO) 300 MG tablet Take 300 mg by mouth daily.    [provider]  methocarbamol (ROBAXIN) 500 MG tablet Take 1 tablet (500 mg total) by mouth at bedtime and may repeat dose one time if needed. 01/17/17   Recardo Evangelist, PA-C  naproxen (NAPROSYN) 500 MG tablet Take 1 tablet (500 mg total) by mouth 2 (two) times daily. 01/17/17   Recardo Evangelist, PA-C    Family History Family History  Problem Relation Age of Onset  . COPD Brother   . COPD Mother   . Heart disease Mother   . Sleep apnea Son   . Cancer - Prostate Brother     Social History Social History  Substance Use Topics  . Smoking status: Former Smoker    Packs/day: 0.50    Years: 30.00  Types: Cigarettes    Quit date: 05/05/1986  . Smokeless tobacco: Never Used  . Alcohol use Not on file     Allergies   Patient has no known allergies.   Review of Systems Review of Systems  Constitutional: Positive for activity change, chills, diaphoresis, fatigue and fever. Negative for appetite change.  HENT: Negative for mouth sores, sore throat and trouble swallowing.   Eyes: Negative for visual disturbance.  Respiratory: Negative for cough, chest tightness, shortness of breath and wheezing.   Cardiovascular: Negative for chest pain.  Gastrointestinal: Positive for constipation. Negative for abdominal distention, abdominal pain, diarrhea, nausea and vomiting.  Endocrine: Negative for polydipsia, polyphagia and polyuria.  Genitourinary: Negative for dysuria, frequency and  hematuria.  Musculoskeletal: Positive for back pain. Negative for gait problem.  Skin: Negative for color change, pallor and rash.  Neurological: Positive for headaches. Negative for dizziness, syncope and light-headedness.  Hematological: Does not bruise/bleed easily.  Psychiatric/Behavioral: Negative for behavioral problems and confusion.     Physical Exam Updated Vital Signs BP (!) 126/58 (BP Location: Left Arm)   Pulse 87   Temp 98.6 F (37 C) (Oral)   Resp 16   SpO2 96%   Physical Exam  Constitutional: He is oriented to person, place, and time. He appears well-developed and well-nourished. No distress.  Oral temp 100.8 by me.  HENT:  Head: Normocephalic.  Eyes: Pupils are equal, round, and reactive to light. Conjunctivae are normal. No scleral icterus.  Neck: Normal range of motion. Neck supple. No thyromegaly present.  No meningismus.  Cardiovascular: Normal rate and regular rhythm.  Exam reveals no gallop and no friction rub.   No murmur heard. Sinus on the monitor. He is not tachycardic.  Pulmonary/Chest: Effort normal and breath sounds normal. No respiratory distress. He has no wheezes. He has no rales.  Clear bilateral breath sounds. Saturating 98%. No adventitial sounds. No increased work of breathing.  Abdominal: Soft. Bowel sounds are normal. He exhibits no distension. There is no tenderness. There is no rebound.  Hyperactive bowel sounds. Distention. No tenderness.  Musculoskeletal: Normal range of motion.  Neurological: He is alert and oriented to person, place, and time.  Achilles and knee jerk DTRs 1-2+ and symmetric. Normal plantarflexion and dorsiflexion. He can flex and extend both hips.  Skin: Skin is warm and dry. No rash noted.  Warm. Dry. No rash.  Psychiatric: He has a normal mood and affect. His behavior is normal.     ED Treatments / Results  Labs (all labs ordered are listed, but only abnormal results are displayed) Labs Reviewed    COMPREHENSIVE METABOLIC PANEL - Abnormal; Notable for the following:       Result Value   Glucose, Bld 112 (*)    BUN 25 (*)    Creatinine, Ser 1.65 (*)    Albumin 3.1 (*)    GFR calc non Af Amer 38 (*)    GFR calc Af Amer 44 (*)    All other components within normal limits  URINALYSIS, ROUTINE W REFLEX MICROSCOPIC - Abnormal; Notable for the following:    Hgb urine dipstick SMALL (*)    Protein, ur >=300 (*)    Squamous Epithelial / LPF 0-5 (*)    All other components within normal limits  CULTURE, BLOOD (ROUTINE X 2)  CULTURE, BLOOD (ROUTINE X 2)  URINE CULTURE  CBC WITH DIFFERENTIAL/PLATELET  I-STAT CG4 LACTIC ACID, ED    EKG  EKG Interpretation None  Radiology Ct Head Wo Contrast  Result Date: 01/24/2017 CLINICAL DATA:  Headache for 3 weeks. EXAM: CT HEAD WITHOUT CONTRAST TECHNIQUE: Contiguous axial images were obtained from the base of the skull through the vertex without intravenous contrast. COMPARISON:  None. FINDINGS: Brain: No subdural, epidural, or subarachnoid hemorrhage. Cerebellum, brainstem, and basal cisterns are normal. Ventricles and sulci are are unremarkable. Scattered white matter changes are identified. No acute cortical ischemia or infarct. No mass effect or midline shift. Vascular: No hyperdense vessel or unexpected calcification. Skull: Normal. Negative for fracture or focal lesion. Sinuses/Orbits: No acute finding. Other: None. IMPRESSION: 1. No acute intracranial abnormalities identified to explain the patient's symptoms. Electronically Signed   By: Dorise Bullion III M.D   On: 01/24/2017 16:01   Dg Chest Port 1 View  Result Date: 01/24/2017 CLINICAL DATA:  Repeat chest x-ray with nipple markers to exclude left lower lobe nodule. EXAM: PORTABLE CHEST 1 VIEW COMPARISON:  01/24/2017 earlier. FINDINGS: Lungs are adequately inflated without consolidation or effusion. No evidence of pulmonary nodule over the left lower lobe. Cardiomediastinal  silhouette and remainder the exam is unchanged. IMPRESSION: No active disease. Electronically Signed   By: Marin Olp M.D.   On: 01/24/2017 17:40   Dg Chest Port 1 View  Result Date: 01/24/2017 CLINICAL DATA:  Headache.  Back injury. EXAM: PORTABLE CHEST 1 VIEW COMPARISON:  08/05/2016 FINDINGS: Normal mediastinum and cardiac silhouette. Normal pulmonary vasculature. No evidence of effusion, infiltrate, or pneumothorax. No acute bony abnormality. Six mm nodule projecting over the LEFT loawer lobe is not seen on comparison exam. This is favored a nipple shadow. IMPRESSION: 1. No acute cardiopulmonary findings. 2. Nodular density projecting over the LEFT lower lobe is favored a nipple shadow. With smoking history, recommend CT thorax or repeat film with nipple markers. Electronically Signed   By: Suzy Bouchard M.D.   On: 01/24/2017 16:20    Procedures Procedures (including critical care time)  Medications Ordered in ED Medications  morphine 4 MG/ML injection 4 mg (4 mg Intravenous Given 01/24/17 1643)  piperacillin-tazobactam (ZOSYN) IVPB 3.375 g (not administered)  morphine 4 MG/ML injection 4 mg (4 mg Intravenous Given 01/24/17 1811)  vancomycin (VANCOCIN) 2,000 mg in sodium chloride 0.9 % 500 mL IVPB (not administered)  acetaminophen (TYLENOL) tablet 650 mg (650 mg Oral Given 01/24/17 1601)  sodium chloride 0.9 % bolus 1,000 mL (0 mLs Intravenous Stopped 01/24/17 1750)  ondansetron (ZOFRAN) injection 4 mg (4 mg Intravenous Given 01/24/17 1643)  methocarbamol (ROBAXIN) 1,000 mg in dextrose 5 % 50 mL IVPB (0 mg Intravenous Stopped 01/24/17 1720)  ondansetron (ZOFRAN) injection 4 mg (4 mg Intravenous Given 01/24/17 1811)     Initial Impression / Assessment and Plan / ED Course  I have reviewed the triage vital signs and the nursing notes.  Pertinent labs & imaging results that were available during my care of the patient were reviewed by me and considered in my medical decision making (see  chart for details).    81 year old male. Back pain and fever. Difficulty walking due to pain but no frank neurological loss. Differential diagnosis is broad. This is new for him regarding any back pain. May ultimately need MRI to rule out discitis, EDA.  Meanwhile we will obtain lactate, chest x-ray, urine, blood, blood cultures, urine cultures, pain control, reevaluation after the above.  Final Clinical Impressions(s) / ED Diagnoses   Final diagnoses:  Acute midline low back pain without sciatica  Fever, unspecified fever cause    No  source for infection. No sepsis by physical exam, vital signs, symptoms, or lab values. Normotensive. Pain improving. Still with no neurological symptoms to suggest myelitis/cauda equina. However with new fever, new back pain imaging of his lumbar spine is indicated. I discussed the case with hospitalist in attendance to arrange inpatient admission for inpatient MRI. We were in agreement that it be best to confirm diagnosis for possible neurosurgical intervention prior to admission. Patient be transferred to [facility] for emergent MRI. Care discussed with the partner Dr. Ellender Hose who accepts the patient in transfer. Patient will be seen by the first available ER physician at [facility]   No medications on file     Tanna Furry, MD 01/24/17 1814

## 2017-01-24 NOTE — ED Provider Notes (Signed)
Patient transferred in from [facility] for MRI of the lumbar spine.  Patient injured his back while helping to lift somebody about 2 weeks ago. Patient's had urgent care in ED visits. When he went to [facility] today that was a second ED visit. He's had x-rays of his back without any acute bony problems. On his presentation there he was also complaining of a headache. Complained of a headache for 3  Weeks.  There he had a negative head CT. Because of the fever there was some concern about possible infection in the lumbar spine area. Patient without significant leukocytosis. White blood cell count was normal. Temperatures here have been normal. Lactic acid was not elevated. Patient with persistent back pain however appears nontoxic no acute distress. MRI of the lumbar back has been ordered and is pending.  If it shows evidence of infection nerve neurosurgery will need to be involved. But if it's negative I think patient can be discharged home. Patient appears clinically stable.   Fredia Sorrow, MD 01/24/17 [phone]

## 2017-01-24 NOTE — Progress Notes (Signed)
A consult was received from an ED physician for Vancomycin per pharmacy dosing.  The patient's profile has been reviewed for ht/wt/allergies/indication/available labs.   A one time order has been placed for Vancomycin 2g IV x1 dose.  Further antibiotics/pharmacy consults should be ordered by admitting physician if indicated.                       Thank you,  Gretta Arab PharmD, BCPS Pager [phone] 01/24/2017 6:07 PM

## 2017-01-25 ENCOUNTER — Emergency Department (HOSPITAL_COMMUNITY): Payer: Medicare Other

## 2017-01-25 LAB — URINE CULTURE: Culture: <10000 — AB

## 2017-01-25 NOTE — ED Notes (Signed)
Patient able to ambulate independently  

## 2017-01-25 NOTE — ED Provider Notes (Signed)
Signed out to check mr and d/c to home if neg for acute process.   Mri neg for acute process, ddd noted. No radicular pain or leg numbness/weakness on recheck of pt. No fevers. No spine tenderness. Pain is right sided lower back.  On review prior plain films, ?right sided calcification. Will get ct.  Ct with large r kidney stone, but no ureteral stones. Adrenal mass noted - will share w pt and have f/u with outpt eval/pcp.      Lajean Saver, MD 01/25/17 [phone]

## 2017-01-25 NOTE — ED Notes (Signed)
ED Provider at bedside. 

## 2017-01-25 NOTE — Discharge Instructions (Signed)
It was our pleasure to provide your ER care today - we hope that you feel better.  Your ct scan was read as follows: IMPRESSION: 1. 12 x 15 mm stone in the mid to lower pole of the right kidney but no hydronephrosis or ureteral stone at this time. Probable cysts within both kidneys. 2. 4.9 cm indeterminate right adrenal gland mass. Recommend nonemergent adrenal CT protocol for further evaluation. 3. Enlarged prostate gland with mild mass effect on the posterior Bladder  For the 4.9 cm right adrenal masses noted on your CT - you must follow up with primary care doctor in 1 week - discuss CT with them, and have them arrange further imaging and further evaluation.  Your MRI shows arthritis and degenerative disc changes.   Take acetaminophen and/or ibuprofen as need for pain.  Follow up with your doctor in the coming week.  Return to ER if worse, new symptoms, high fevers, new or intractable pain, numbness/weakness, other concern.   You were given pain medication in the ER - no driving for the next 6 hours.

## 2017-01-25 NOTE — ED Notes (Signed)
Patient verbalized understanding of discharge instructions, pain management at home, and follow up with PCP.  Signature pad in room not functioning at this time.  All questions answered, and return precautions reviewed.

## 2017-01-29 LAB — CULTURE, BLOOD (ROUTINE X 2)
CULTURE: NO GROWTH
Culture: NO GROWTH
SPECIAL REQUESTS: ADEQUATE
Special Requests: ADEQUATE

## 2017-04-01 ENCOUNTER — Other Ambulatory Visit: Payer: Self-pay | Admitting: Internal Medicine

## 2017-04-01 DIAGNOSIS — R519 Headache, unspecified: Secondary | ICD-10-CM

## 2017-04-01 DIAGNOSIS — R51 Headache: Principal | ICD-10-CM

## 2017-04-01 DIAGNOSIS — M542 Cervicalgia: Secondary | ICD-10-CM

## 2017-04-04 ENCOUNTER — Ambulatory Visit (HOSPITAL_COMMUNITY)
Admission: RE | Admit: 2017-04-04 | Discharge: 2017-04-04 | Disposition: A | Discharge status: Home / Self Care | Payer: Medicare Other | Source: Ambulatory Visit | Attending: Internal Medicine | Admitting: Internal Medicine

## 2017-04-04 DIAGNOSIS — M4712 Other spondylosis with myelopathy, cervical region: Secondary | ICD-10-CM | POA: Insufficient documentation

## 2017-04-04 DIAGNOSIS — R51 Headache: Secondary | ICD-10-CM | POA: Diagnosis present

## 2017-04-04 DIAGNOSIS — M4802 Spinal stenosis, cervical region: Secondary | ICD-10-CM | POA: Insufficient documentation

## 2017-04-04 DIAGNOSIS — M1288 Other specific arthropathies, not elsewhere classified, other specified site: Secondary | ICD-10-CM | POA: Insufficient documentation

## 2017-04-04 DIAGNOSIS — M542 Cervicalgia: Secondary | ICD-10-CM | POA: Insufficient documentation

## 2017-04-04 DIAGNOSIS — R519 Headache, unspecified: Secondary | ICD-10-CM

## 2017-04-07 ENCOUNTER — Other Ambulatory Visit: Payer: Self-pay | Admitting: Urology

## 2017-04-07 DIAGNOSIS — R972 Elevated prostate specific antigen [PSA]: Secondary | ICD-10-CM

## 2017-04-23 ENCOUNTER — Ambulatory Visit
Admission: RE | Admit: 2017-04-23 | Discharge: 2017-04-23 | Disposition: A | Discharge status: Home / Self Care | Payer: Medicare Other | Source: Ambulatory Visit | Attending: Urology | Admitting: Urology

## 2017-04-23 DIAGNOSIS — R972 Elevated prostate specific antigen [PSA]: Secondary | ICD-10-CM

## 2017-04-23 MED ORDER — GADOBENATE DIMEGLUMINE 529 MG/ML IV SOLN
20.0000 mL | Freq: Once | INTRAVENOUS | Status: AC | PRN
  Administered 2017-04-23: 20 mL via INTRAVENOUS

## 2017-08-07 ENCOUNTER — Other Ambulatory Visit: Payer: Self-pay | Admitting: Internal Medicine

## 2017-08-07 ENCOUNTER — Ambulatory Visit
Admission: RE | Admit: 2017-08-07 | Discharge: 2017-08-07 | Disposition: A | Discharge status: Home / Self Care | Payer: Medicare Other | Source: Ambulatory Visit | Attending: Internal Medicine | Admitting: Internal Medicine

## 2017-08-07 DIAGNOSIS — K5900 Constipation, unspecified: Secondary | ICD-10-CM

## 2017-09-14 ENCOUNTER — Other Ambulatory Visit (HOSPITAL_COMMUNITY): Payer: Self-pay | Admitting: Urology

## 2017-09-14 DIAGNOSIS — E278 Other specified disorders of adrenal gland: Secondary | ICD-10-CM

## 2017-09-15 ENCOUNTER — Other Ambulatory Visit (HOSPITAL_COMMUNITY): Payer: Self-pay | Admitting: Urology

## 2017-09-15 DIAGNOSIS — E278 Other specified disorders of adrenal gland: Secondary | ICD-10-CM

## 2017-09-22 ENCOUNTER — Other Ambulatory Visit: Payer: Self-pay | Admitting: Physician Assistant

## 2017-09-23 ENCOUNTER — Ambulatory Visit (HOSPITAL_COMMUNITY)
Admission: RE | Admit: 2017-09-23 | Discharge: 2017-09-23 | Disposition: A | Discharge status: Home / Self Care | Payer: Medicare Other | Source: Ambulatory Visit | Attending: Urology | Admitting: Urology

## 2017-09-23 ENCOUNTER — Encounter (HOSPITAL_COMMUNITY): Payer: Self-pay

## 2017-09-23 ENCOUNTER — Encounter: Payer: Self-pay | Admitting: Student

## 2017-09-23 DIAGNOSIS — E278 Other specified disorders of adrenal gland: Secondary | ICD-10-CM

## 2017-09-23 DIAGNOSIS — E279 Disorder of adrenal gland, unspecified: Secondary | ICD-10-CM | POA: Diagnosis present

## 2017-09-23 LAB — GLUCOSE, CAPILLARY: Glucose-Capillary: 70 mg/dL (ref 65–99)

## 2017-09-23 MED ORDER — SODIUM CHLORIDE 0.9 % IV SOLN: INTRAVENOUS | Status: DC

## 2017-09-23 NOTE — Progress Notes (Signed)
**Note Azlyn Wingler-Identified via Obfuscation** Patient's procedure canceled per Radiologist, Dr. Lenox Ahr.

## 2017-09-23 NOTE — Progress Notes (Signed)
Patient ID: Brandon Weaver, male   DOB: January 21, 1936, 82 y.o.   MRN: [id number]   Patient with plans for CT-guided biopsy in radiology department today.  Upon review of case, patient has a primary adrenal mass with hypertension. Dr. Barbie Banner concerned for possible pheochromocytoma.   PA called to [facility] to inquire about patient work-up.  A cortisol and aldosterone level have been drawn, but not methamphetamines.  Dr. Barbie Banner has discussed case with patient.  Due to concern for hypertensive crisis or hemorrhage with biopsy procedure cancelled today.  Patient to return to Dr. Gilford Rile for further discussion prior to biopsy.  Left message for Dr. Gilford Rile with contact information for Dr. Barbie Banner if needed.   Brynda Greathouse, MS RD PA-C 11:45 AM

## 2017-10-08 ENCOUNTER — Other Ambulatory Visit: Payer: Self-pay | Admitting: Student

## 2017-10-09 ENCOUNTER — Ambulatory Visit (HOSPITAL_COMMUNITY)
Admission: RE | Admit: 2017-10-09 | Discharge: 2017-10-09 | Disposition: A | Discharge status: Home / Self Care | Payer: Medicare Other | Source: Ambulatory Visit | Attending: Urology | Admitting: Urology

## 2017-10-09 ENCOUNTER — Encounter (HOSPITAL_COMMUNITY): Payer: Self-pay

## 2017-10-09 DIAGNOSIS — G4730 Sleep apnea, unspecified: Secondary | ICD-10-CM | POA: Diagnosis not present

## 2017-10-09 DIAGNOSIS — Z79899 Other long term (current) drug therapy: Secondary | ICD-10-CM | POA: Diagnosis not present

## 2017-10-09 DIAGNOSIS — E119 Type 2 diabetes mellitus without complications: Secondary | ICD-10-CM | POA: Diagnosis not present

## 2017-10-09 DIAGNOSIS — Z794 Long term (current) use of insulin: Secondary | ICD-10-CM | POA: Insufficient documentation

## 2017-10-09 DIAGNOSIS — E279 Disorder of adrenal gland, unspecified: Secondary | ICD-10-CM | POA: Insufficient documentation

## 2017-10-09 DIAGNOSIS — I10 Essential (primary) hypertension: Secondary | ICD-10-CM | POA: Diagnosis not present

## 2017-10-09 DIAGNOSIS — Z87891 Personal history of nicotine dependence: Secondary | ICD-10-CM | POA: Insufficient documentation

## 2017-10-09 HISTORY — DX: Sleep apnea, unspecified: G47.30

## 2017-10-09 LAB — CBC
HCT: 44.3 % (ref 39.0–52.0)
Hemoglobin: 13.9 g/dL (ref 13.0–17.0)
MCH: 27.7 pg (ref 26.0–34.0)
MCHC: 31.4 g/dL (ref 30.0–36.0)
MCV: 88.4 fL (ref 78.0–100.0)
Platelets: 237 10*3/uL (ref 150–400)
RBC: 5.01 MIL/uL (ref 4.22–5.81)
RDW: 14 % (ref 11.5–15.5)
WBC: 5.1 10*3/uL (ref 4.0–10.5)

## 2017-10-09 LAB — APTT: APTT: 29 s (ref 24–36)

## 2017-10-09 LAB — PROTIME-INR
INR: 1.01
PROTHROMBIN TIME: 13.2 s (ref 11.4–15.2)

## 2017-10-09 LAB — GLUCOSE, CAPILLARY
GLUCOSE-CAPILLARY: 83 mg/dL (ref 65–99)
Glucose-Capillary: 89 mg/dL (ref 65–99)

## 2017-10-09 MED ORDER — MIDAZOLAM HCL 2 MG/2ML IJ SOLN
INTRAMUSCULAR | Status: AC | PRN
  Administered 2017-10-09: 1 mg via INTRAVENOUS

## 2017-10-09 MED ORDER — FENTANYL CITRATE (PF) 100 MCG/2ML IJ SOLN
INTRAMUSCULAR | Status: AC
  Filled 2017-10-09: qty 4

## 2017-10-09 MED ORDER — FENTANYL CITRATE (PF) 100 MCG/2ML IJ SOLN
INTRAMUSCULAR | Status: AC | PRN
  Administered 2017-10-09: 50 ug via INTRAVENOUS

## 2017-10-09 MED ORDER — LIDOCAINE HCL 1 % IJ SOLN
INTRAMUSCULAR | Status: AC
  Filled 2017-10-09: qty 20

## 2017-10-09 MED ORDER — MIDAZOLAM HCL 2 MG/2ML IJ SOLN
INTRAMUSCULAR | Status: AC
  Filled 2017-10-09: qty 4

## 2017-10-09 MED ORDER — SODIUM CHLORIDE 0.9 % IV SOLN: INTRAVENOUS | Status: DC

## 2017-10-09 MED ORDER — HYDROCODONE-ACETAMINOPHEN 5-325 MG PO TABS: 1.0000 | ORAL_TABLET | ORAL | Status: DC | PRN

## 2017-10-09 NOTE — Discharge Instructions (Signed)

## 2017-10-09 NOTE — Procedures (Signed)
  Pre-operative Diagnosis: Right adrenal mass       Post-operative Diagnosis: Right adrenal mass   Indications: Enlarging right adrenal mass  Procedure: CT guided core biopsy of right adrenal mass  Findings: Scant bloody material collected.  Minimal bleeding at biopsy site, stable on follow up imaging.    Complications: None     EBL: Minimal  Plan: Bedrest 3 hours.

## 2017-10-09 NOTE — H&P (Addendum)
Chief Complaint: Patient was seen in consultation today for right adrenal mass biopsy at the request of Winter,Christopher Marjory Lies  Referring Physician(s): Winter,Christopher Marjory Lies  Supervising Physician: [name]  Patient Status: [facility] - Out-pt  History of Present Illness: Brandon Weaver is a 82 y.o. male   Sl increased PSA Followed by Dr Gerald Stabs Lovena Neighbours   08/24/17 CT IMPRESSION: 1. Enlarging right adrenal mass as detailed above. 2. Stable bilateral renal cysts. No worrisome renal lesions. 3. No acute abdominal findings or lymphadenopathy.  Scheduled for Rt adrenal mass biopsy  Was scheduled for same procedure 5/22 Although Aldosterone and Cortisol lab works were [employer] lab work had been drawn--- procedure was cancelled for same to be complete. Pt sates he had blood drawn at Office next day or two.  Cortisol 1.7  08/27/17 Aldosterone 1.0  09/14/17 NorMetanephrine 109  08/17/17 Metanephrine <25  08/17/17  These values per RN via phone Faxing to me now  Per Dr Lovena Neighbours note: Imaging consistent with adrenocortical carcinoma-- before considering adrenalectomy- wants biopsy to rule out secondary malignancy/meatastasis  Past Medical History:  Diagnosis Date  . Diabetes ([place])   . Hypertension   . Sleep apnea     Past Surgical History:  Procedure Laterality Date  . L shoulder surgery     muscle repair  . reflux surgery    . TONSILLECTOMY      Allergies: Patient has no known allergies.  Medications: Prior to Admission medications   Medication Sig Start Date End Date Taking? Authorizing Provider  amLODipine (NORVASC) 10 MG tablet Take 10 mg by mouth daily.   Yes [provider]  atorvastatin (LIPITOR) 10 MG tablet Take 10 mg by mouth daily.   Yes [provider]  insulin aspart protamine- aspart (NOVOLOG MIX 70/30) (70-30) 100 UNIT/ML injection Inject 45 Units into the skin 2 (two) times daily with a meal.   Yes [provider]  irbesartan (AVAPRO) 300 MG tablet Take 300 mg by mouth daily.   Yes [provider]  acetaminophen (TYLENOL) 325 MG tablet Take 650 mg by mouth every 6 (six) hours.     [provider]     Family History  Problem Relation Age of Onset  . COPD Brother   . COPD Mother   . Heart disease Mother   . Sleep apnea Son   . Cancer - Prostate Brother     Social History   Socioeconomic History  . Marital status: Married    Spouse name: Not on file  . Number of children: Not on file  . Years of education: Not on file  . Highest education level: Not on file  Occupational History  . Not on file  Social Needs  . Financial resource strain: Not on file  . Food insecurity:    Worry: Not on file    Inability: Not on file  . Transportation needs:    Medical: Not on file    Non-medical: Not on file  Tobacco Use  . Smoking status: Former Smoker    Packs/day: 0.50    Years: 30.00    Pack years: 15.00    Types: Cigarettes    Last attempt to quit: 05/05/1986    Years since quitting: 31.4  . Smokeless tobacco: Never Used  Substance and Sexual Activity  . Alcohol use: Not on file  . Drug use: Not on file  . Sexual activity: Not on file  Lifestyle  . Physical activity:    Days  per week: Not on file    Minutes per session: Not on file  . Stress: Not on file  Relationships  . Social connections:    Talks on phone: Not on file    Gets together: Not on file    Attends religious service: Not on file    Active member of club or organization: Not on file    Attends meetings of clubs or organizations: Not on file    Relationship status: Not on file  Other Topics Concern  . Not on file  Social History Narrative  . Not on file    Review of Systems: A 12 point ROS discussed and pertinent positives are indicated in the HPI above.  All other systems are negative.  Review of Systems  Constitutional: Negative for activity change, appetite change, fatigue and  fever.  Respiratory: Negative for cough and shortness of breath.   Cardiovascular: Negative for chest pain.  Gastrointestinal: Negative for abdominal pain.  Neurological: Negative for weakness.  Psychiatric/Behavioral: Negative for behavioral problems and confusion.    Vital Signs: BP (!) 155/68   Pulse 71   Temp 98.4 F (36.9 C)   Resp 20   Ht 5\' 10"  (1.778 m)   Wt 244 lb (110.7 kg)   SpO2 99%   BMI 35.01 kg/m   Physical Exam  Constitutional: He is oriented to person, place, and time.  Cardiovascular: Normal rate and regular rhythm.  Pulmonary/Chest: Effort normal.  Abdominal: Soft. Bowel sounds are normal.  Musculoskeletal: Normal range of motion.  Neurological: He is alert and oriented to person, place, and time.  Skin: Skin is warm and dry.  Psychiatric: He has a normal mood and affect. His behavior is normal. Judgment and thought content normal.  Nursing note and vitals reviewed.   Imaging: No results found.  Labs:  CBC: Recent Labs    01/24/17 1520  WBC 8.3  HGB 13.1  HCT 39.9  PLT 350    COAGS: No results for input(s): INR, APTT in the last 8760 hours.  BMP: Recent Labs    01/24/17 1520  NA 135  K 4.6  CL 104  CO2 23  GLUCOSE 112*  BUN 25*  CALCIUM 9.0  CREATININE 1.65*  GFRNONAA 38*  GFRAA 44*    LIVER FUNCTION TESTS: Recent Labs    01/24/17 1520  BILITOT 0.5  AST 26  ALT 36  ALKPHOS 64  PROT 7.5  ALBUMIN 3.1*    TUMOR MARKERS: No results for input(s): AFPTM, CEA, CA199, CHROMGRNA in the last 8760 hours.  Assessment and Plan:  Right adrenal mass- enlarging per imaging Elevated PSA Scheduled for biopsy today Labs being faxed to [place] now Will discuss with Dr Anselm Pancoast  Risks and benefits discussed with the patient including, but not limited to bleeding, infection, damage to adjacent structures or low yield requiring additional tests.  All of the patient's questions were answered, patient is agreeable to proceed. Consent signed  and in chart.    Thank you for this interesting consult.  I greatly enjoyed meeting Brandon Weaver and look forward to participating in their care.  A copy of this report was sent to the requesting provider on this date.  Electronically Signed: Lavonia Drafts, PA-C 10/09/2017, 9:52 AM   I spent a total of    40 Minutes in face to face in clinical consultation, greater than 50% of which was counseling/coordinating care for rt adrenal mass bx

## 2018-09-09 ENCOUNTER — Other Ambulatory Visit: Payer: Self-pay | Admitting: Nephrology

## 2018-09-09 DIAGNOSIS — N183 Chronic kidney disease, stage 3 unspecified: Secondary | ICD-10-CM

## 2018-09-20 ENCOUNTER — Ambulatory Visit
Admission: RE | Admit: 2018-09-20 | Discharge: 2018-09-20 | Disposition: A | Discharge status: Home / Self Care | Payer: Medicare Other | Source: Ambulatory Visit | Attending: Nephrology | Admitting: Nephrology

## 2018-09-20 DIAGNOSIS — N183 Chronic kidney disease, stage 3 unspecified: Secondary | ICD-10-CM

## 2018-11-10 ENCOUNTER — Other Ambulatory Visit: Payer: Self-pay | Admitting: Critical Care Medicine

## 2018-11-10 DIAGNOSIS — Z20822 Contact with and (suspected) exposure to covid-19: Secondary | ICD-10-CM

## 2018-11-15 LAB — NOVEL CORONAVIRUS, NAA: SARS-CoV-2, NAA: NOT DETECTED

## 2018-11-30 ENCOUNTER — Other Ambulatory Visit (HOSPITAL_COMMUNITY): Payer: Self-pay | Admitting: Urology

## 2018-11-30 DIAGNOSIS — D3501 Benign neoplasm of right adrenal gland: Secondary | ICD-10-CM

## 2018-12-14 ENCOUNTER — Other Ambulatory Visit: Payer: Self-pay | Admitting: Physician Assistant

## 2018-12-14 ENCOUNTER — Other Ambulatory Visit: Payer: Self-pay | Admitting: Radiology

## 2018-12-15 ENCOUNTER — Other Ambulatory Visit: Payer: Self-pay

## 2018-12-15 ENCOUNTER — Ambulatory Visit (HOSPITAL_COMMUNITY)
Admission: RE | Admit: 2018-12-15 | Discharge: 2018-12-15 | Disposition: A | Discharge status: Home / Self Care | Payer: Medicare Other | Source: Ambulatory Visit | Attending: Urology | Admitting: Urology

## 2018-12-15 DIAGNOSIS — I10 Essential (primary) hypertension: Secondary | ICD-10-CM | POA: Diagnosis not present

## 2018-12-15 DIAGNOSIS — D3501 Benign neoplasm of right adrenal gland: Secondary | ICD-10-CM | POA: Diagnosis present

## 2018-12-15 DIAGNOSIS — Z79899 Other long term (current) drug therapy: Secondary | ICD-10-CM | POA: Insufficient documentation

## 2018-12-15 DIAGNOSIS — Z7982 Long term (current) use of aspirin: Secondary | ICD-10-CM | POA: Insufficient documentation

## 2018-12-15 DIAGNOSIS — Z87891 Personal history of nicotine dependence: Secondary | ICD-10-CM | POA: Insufficient documentation

## 2018-12-15 DIAGNOSIS — G4730 Sleep apnea, unspecified: Secondary | ICD-10-CM | POA: Diagnosis not present

## 2018-12-15 DIAGNOSIS — Z794 Long term (current) use of insulin: Secondary | ICD-10-CM | POA: Diagnosis not present

## 2018-12-15 DIAGNOSIS — Z8249 Family history of ischemic heart disease and other diseases of the circulatory system: Secondary | ICD-10-CM | POA: Insufficient documentation

## 2018-12-15 DIAGNOSIS — Z8042 Family history of malignant neoplasm of prostate: Secondary | ICD-10-CM | POA: Diagnosis not present

## 2018-12-15 DIAGNOSIS — E119 Type 2 diabetes mellitus without complications: Secondary | ICD-10-CM | POA: Insufficient documentation

## 2018-12-15 LAB — GLUCOSE, CAPILLARY
Glucose-Capillary: 83 mg/dL (ref 70–99)
Glucose-Capillary: 86 mg/dL (ref 70–99)

## 2018-12-15 LAB — CBC
HCT: 41.3 % (ref 39.0–52.0)
Hemoglobin: 13.3 g/dL (ref 13.0–17.0)
MCH: 28.5 pg (ref 26.0–34.0)
MCHC: 32.2 g/dL (ref 30.0–36.0)
MCV: 88.4 fL (ref 80.0–100.0)
Platelets: 207 10*3/uL (ref 150–400)
RBC: 4.67 MIL/uL (ref 4.22–5.81)
RDW: 13.4 % (ref 11.5–15.5)
WBC: 4.7 10*3/uL (ref 4.0–10.5)
nRBC: 0 % (ref 0.0–0.2)

## 2018-12-15 LAB — PROTIME-INR
INR: 1.1 (ref 0.8–1.2)
Prothrombin Time: 13.9 seconds (ref 11.4–15.2)

## 2018-12-15 LAB — APTT: aPTT: 30 seconds (ref 24–36)

## 2018-12-15 MED ORDER — FENTANYL CITRATE (PF) 100 MCG/2ML IJ SOLN
INTRAMUSCULAR | Status: AC | PRN
  Administered 2018-12-15 (×2): 50 ug via INTRAVENOUS

## 2018-12-15 MED ORDER — MIDAZOLAM HCL 2 MG/2ML IJ SOLN
INTRAMUSCULAR | Status: AC | PRN
  Administered 2018-12-15 (×2): 1 mg via INTRAVENOUS

## 2018-12-15 MED ORDER — MIDAZOLAM HCL 2 MG/2ML IJ SOLN
INTRAMUSCULAR | Status: AC
  Filled 2018-12-15: qty 2

## 2018-12-15 MED ORDER — FENTANYL CITRATE (PF) 100 MCG/2ML IJ SOLN
INTRAMUSCULAR | Status: AC
  Filled 2018-12-15: qty 2

## 2018-12-15 MED ORDER — SODIUM CHLORIDE 0.9 % IV SOLN: INTRAVENOUS | Status: DC

## 2018-12-15 MED ORDER — HYDROCODONE-ACETAMINOPHEN 5-325 MG PO TABS
1.0000 | ORAL_TABLET | ORAL | Status: DC | PRN
  Filled 2018-12-15: qty 2

## 2018-12-15 MED ORDER — LIDOCAINE HCL 1 % IJ SOLN
INTRAMUSCULAR | Status: AC
  Filled 2018-12-15: qty 20

## 2018-12-15 NOTE — Discharge Instructions (Signed)
Moderate Conscious Sedation, Adult, Care After °These instructions provide you with information about caring for yourself after your procedure. Your health care provider may also give you more specific instructions. Your treatment has been planned according to current medical practices, but problems sometimes occur. Call your health care provider if you have any problems or questions after your procedure. °What can I expect after the procedure? °After your procedure, it is common: °· To feel sleepy for several hours. °· To feel clumsy and have poor balance for several hours. °· To have poor judgment for several hours. °· To vomit if you eat too soon. °Follow these instructions at home: °For at least 24 hours after the procedure: ° °· Do not: °? Participate in activities where you could fall or become injured. °? Drive. °? Use heavy machinery. °? Drink alcohol. °? Take sleeping pills or medicines that cause drowsiness. °? Make important decisions or sign legal documents. °? Take care of children on your own. °· Rest. °Eating and drinking °· Follow the diet recommended by your health care provider. °· If you vomit: °? Drink water, juice, or soup when you can drink without vomiting. °? Make sure you have little or no nausea before eating solid foods. °General instructions °· Have a responsible adult stay with you until you are awake and alert. °· Take over-the-counter and prescription medicines only as told by your health care provider. °· If you smoke, do not smoke without supervision. °· Keep all follow-up visits as told by your health care provider. This is important. °Contact a health care provider if: °· You keep feeling nauseous or you keep vomiting. °· You feel light-headed. °· You develop a rash. °· You have a fever. °Get help right away if: °· You have trouble breathing. °This information is not intended to replace advice given to you by your health care provider. Make sure you discuss any questions you have  with your health care provider. °Document Released: 02/09/2013 Document Revised: 04/03/2017 Document Reviewed: 08/11/2015 °Elsevier Patient Education © 2020 [employer] °Needle Biopsy, Care After °These instructions tell you how to care for yourself after your procedure. Your doctor may also give you more specific instructions. Call your doctor if you have any problems or questions. °What can I expect after the procedure? °After the procedure, it is common to have: °· Soreness. °· Bruising. °· Mild pain. °Follow these instructions at home: ° °· Return to your normal activities as told by your doctor. Ask your doctor what activities are safe for you. °· Take over-the-counter and prescription medicines only as told by your doctor. °· Wash your hands with soap and water before you change your bandage (dressing). If you cannot use soap and water, use hand sanitizer. °· Follow instructions from your doctor about: °? How to take care of your puncture site. °? When and how to change your bandage. °? When to remove your bandage. °· Check your puncture site every day for signs of infection. Watch for: °? Redness, swelling, or pain. °? Fluid or blood.  °? Pus or a bad smell. °? Warmth. °· Do not take baths, swim, or use a hot tub until your doctor approves. Ask your doctor if you may take showers. You may only be allowed to take sponge baths. °· Keep all follow-up visits as told by your doctor. This is important. °Contact a doctor if you have: °· A fever. °· Redness, swelling, or pain at the puncture site, and it lasts longer than a few   days. °· Fluid, blood, or pus coming from the puncture site. °· Warmth coming from the puncture site. °Get help right away if: °· You have a lot of bleeding from the puncture site. °Summary °· After the procedure, it is common to have soreness, bruising, or mild pain at the puncture site. °· Check your puncture site every day for signs of infection, such as redness, swelling, or pain. °· Get  help right away if you have severe bleeding from your puncture site. °This information is not intended to replace advice given to you by your health care provider. Make sure you discuss any questions you have with your health care provider. °Document Released: 04/03/2008 Document Revised: 05/04/2017 Document Reviewed: 05/04/2017 °Elsevier Patient Education © 2020 Elsevier Inc. ° °

## 2018-12-15 NOTE — Procedures (Signed)
Interventional Radiology Procedure:   Indications: Enlarging right adrenal mass, prior biopsy was negative for malignancy  Procedure: CT guided core biopsy of right adrenal mass  Findings: Needle position confirmed in adrenal mass.  Scant bloody material obtained.  Complications: None     EBL: less than 20 ml  Plan: Bedrest 3 hours, then discharge to home.     Ayaana Biondo R. Anselm Pancoast, MD  Pager: [phone]

## 2018-12-15 NOTE — H&P (Signed)
Chief Complaint: Patient was seen in consultation today for right adrenal mass biopsy.  Referring Physician(s): Winter,Christopher Marjory Lies  Supervising Physician: [name]  Patient Status: [facility] - Out-pt  History of Present Illness: Brandon Weaver is a 83 y.o. male with a past medical history significant for sleep apnea, HTN, DM and known right adrenal mass who presents today for a biopsy of the adrenal mass. Patient previously underwent CT biopsy of this mass on 0/2/77 without complication however when the pathology results were returned it was noted to consist of blood, inflammatory cells and fibrinous material. A request has been made for a repeat biopsy for tissue diagnosis.  Patient reports a chronic, dry cough which is unchanged - he otherwise has no complaints today. He states understanding of the procedure and wishes to proceed.   Past Medical History:  Diagnosis Date  . Diabetes ([place])   . Hypertension   . Sleep apnea     Past Surgical History:  Procedure Laterality Date  . L shoulder surgery     muscle repair  . reflux surgery    . TONSILLECTOMY      Allergies: Patient has no known allergies.  Medications: Prior to Admission medications   Medication Sig Start Date End Date Taking? Authorizing Provider  acetaminophen (TYLENOL) 325 MG tablet Take 650 mg by mouth every 6 (six) hours.    Yes [provider]  amLODipine (NORVASC) 10 MG tablet Take 10 mg by mouth daily.   Yes [provider]  aspirin EC 81 MG tablet Take 81 mg by mouth daily.   Yes [provider]  insulin aspart protamine- aspart (NOVOLOG MIX 70/30) (70-30) 100 UNIT/ML injection Inject 45 Units into the skin 2 (two) times daily with a meal.   Yes [provider]  irbesartan (AVAPRO) 300 MG tablet Take 300 mg by mouth daily.   Yes [provider]  tamsulosin (FLOMAX) 0.4 MG CAPS capsule Take 0.4 mg by mouth daily after supper.   Yes [provider]      Family History  Problem Relation Age of Onset  . COPD Brother   . COPD Mother   . Heart disease Mother   . Sleep apnea Son   . Cancer - Prostate Brother     Social History   Socioeconomic History  . Marital status: Married    Spouse name: Not on file  . Number of children: Not on file  . Years of education: Not on file  . Highest education level: Not on file  Occupational History  . Not on file  Social Needs  . Financial resource strain: Not on file  . Food insecurity    Worry: Not on file    Inability: Not on file  . Transportation needs    Medical: Not on file    Non-medical: Not on file  Tobacco Use  . Smoking status: Former Smoker    Packs/day: 0.50    Years: 30.00    Pack years: 15.00    Types: Cigarettes    Quit date: 05/05/1986    Years since quitting: 32.6  . Smokeless tobacco: Never Used  Substance and Sexual Activity  . Alcohol use: Not on file  . Drug use: Not on file  . Sexual activity: Not on file  Lifestyle  . Physical activity    Days per week: Not on file    Minutes per session: Not on file  . Stress: Not on file  Relationships  . Social  connections    Talks on phone: Not on file    Gets together: Not on file    Attends religious service: Not on file    Active member of club or organization: Not on file    Attends meetings of clubs or organizations: Not on file    Relationship status: Not on file  Other Topics Concern  . Not on file  Social History Narrative  . Not on file     Review of Systems: A 12 point ROS discussed and pertinent positives are indicated in the HPI above.  All other systems are negative.  Review of Systems  Constitutional: Negative for appetite change, chills and fever.  Respiratory: Positive for cough (chronic; dry). Negative for shortness of breath.   Cardiovascular: Negative for chest pain.  Gastrointestinal: Negative for abdominal pain, blood in stool, diarrhea, nausea and vomiting.  Genitourinary: Negative  for dysuria and hematuria.  Musculoskeletal: Negative for back pain.  Skin: Negative for rash.  Neurological: Positive for headaches (occasionally - none currently). Negative for dizziness and syncope.    Vital Signs: BP (!) 163/65   Pulse (!) 55   Temp (!) 97.2 F (36.2 C) (Skin)   Resp 18   Ht 5\' 10"  (1.778 m)   Wt 244 lb (110.7 kg)   SpO2 97%   BMI 35.01 kg/m   Physical Exam Vitals signs reviewed.  Constitutional:      General: He is not in acute distress.    Appearance: He is obese.  HENT:     Head: Normocephalic.     Mouth/Throat:     Mouth: Mucous membranes are moist.     Pharynx: Oropharynx is clear. No oropharyngeal exudate or posterior oropharyngeal erythema.  Cardiovascular:     Rate and Rhythm: Normal rate and regular rhythm.  Pulmonary:     Effort: Pulmonary effort is normal.     Breath sounds: Normal breath sounds.  Abdominal:     General: Bowel sounds are normal. There is no distension.     Palpations: Abdomen is soft.     Tenderness: There is no abdominal tenderness.  Skin:    General: Skin is warm and dry.  Neurological:     Mental Status: He is alert and oriented to person, place, and time.  Psychiatric:        Mood and Affect: Mood normal.        Behavior: Behavior normal.        Thought Content: Thought content normal.        Judgment: Judgment normal.      MD Evaluation Airway: WNL Heart: WNL Abdomen: WNL Chest/ Lungs: WNL ASA  Classification: 2 Mallampati/Airway Score: Three   Imaging: No results found.  Labs:  CBC: Recent Labs    12/15/18 0904  WBC 4.7  HGB 13.3  HCT 41.3  PLT 207    COAGS: No results for input(s): INR, APTT in the last 8760 hours.  BMP: No results for input(s): NA, K, CL, CO2, GLUCOSE, BUN, CALCIUM, CREATININE, GFRNONAA, GFRAA in the last 8760 hours.  Invalid input(s): CMP  LIVER FUNCTION TESTS: No results for input(s): BILITOT, AST, ALT, ALKPHOS, PROT, ALBUMIN in the last 8760 hours.  TUMOR  MARKERS: No results for input(s): AFPTM, CEA, CA199, CHROMGRNA in the last 8760 hours.  Assessment and Plan:  83 y/o M with known right adrenal mass previously biopsied who presents today for a repeat biopsy of this mass due to previous biopsy consisting of blood, inflammatory cells  and fibrinous material.  Patient has been NPO since 11 pm, he took his morning medications with a sip of water. Afebrile, WBC 4.7, hgb 13.3, plt 207, INR pending.  Risks and benefits of right adrenal mass biopsy was discussed with the patient and/or patient's family including, but not limited to bleeding, infection, damage to adjacent structures or low yield requiring additional tests.  All of the questions were answered and there is agreement to proceed.  Consent signed and in chart.   Thank you for this interesting consult.  I greatly enjoyed meeting Brandon Weaver and look forward to participating in their care.  A copy of this report was sent to the requesting provider on this date.  Electronically Signed: Joaquim Nam, PA-C 12/15/2018, 10:13 AM   I spent a total of 15 Minutes in face to face in clinical consultation, greater than 50% of which was counseling/coordinating care for right adrenal mass biopsy.

## 2019-01-25 ENCOUNTER — Other Ambulatory Visit: Payer: Self-pay | Admitting: Urology

## 2019-03-10 NOTE — Patient Instructions (Signed)
DUE TO COVID-19 ONLY ONE VISITOR IS ALLOWED TO COME WITH YOU AND STAY IN THE WAITING ROOM ONLY DURING PRE OP AND PROCEDURE DAY OF SURGERY. THE 1 VISITOR MAY VISIT WITH YOU AFTER SURGERY IN YOUR PRIVATE ROOM DURING VISITING HOURS ONLY!  YOU NEED TO HAVE A COVID 19 TEST ON_______ @_______ , THIS TEST MUST BE DONE BEFORE SURGERY, COME  Brandon Weaver, [place] , [id number].  ([place]) ONCE YOUR COVID TEST IS COMPLETED, PLEASE BEGIN THE QUARANTINE INSTRUCTIONS AS OUTLINED IN YOUR HANDOUT.                Brandon Weaver  03/10/2019   Your procedure is scheduled on: 03-16-19   Report to [facility] Main  Entrance   Report to admitting at          [place] AM     Call this number if you have problems the morning of surgery [phone]    Remember:    Follow a Clear liquid diet the day before surgery                                      Drink one bottle of magnesium citrate by noon day before surgery    CLEAR LIQUID DIET   Foods Allowed                                                                              Foods Excluded  Coffee and tea, regular and decaf                                               liquids that you cannot  Plain Jell-O any favor except red or purple                                           see through such as: Fruit ices (not with fruit pulp)                                                          milk, soups, orange juice  Iced Popsicles                                                          All solid food Carbonated beverages, regular and diet                                    Cranberry, grape  and apple juices Sports drinks like Gatorade Lightly seasoned clear broth or consume(fat free) Sugar, honey syrup  Sample Menu Breakfast                                Lunch                                     Supper Cranberry juice                    Beef broth                            Chicken broth Jell-O                                      Grape juice                           Apple juice Coffee or tea                        Jell-O                                      Popsicle                                                Coffee or tea                        Coffee or tea  _____________________________________________________________________   BRUSH YOUR TEETH MORNING OF SURGERY AND RINSE YOUR MOUTH OUT, NO CHEWING GUM CANDY OR MINTS.     Take these medicines the morning of surgery with A SIP OF WATER: amlodipine   How to Manage Your Diabetes Before and After Surgery  Why is it important to control my blood sugar before and after surgery? . Improving blood sugar levels before and after surgery helps healing and can limit problems. . A way of improving blood sugar control is eating a healthy diet by: o  Eating less sugar and carbohydrates o  Increasing activity/exercise o  Talking with your doctor about reaching your blood sugar goals . High blood sugars (greater than 180 mg/dL) can raise your risk of infections and slow your recovery, so you will need to focus on controlling your diabetes during the weeks before surgery. . Make sure that the doctor who takes care of your diabetes knows about your planned surgery including the date and location.  How do I manage my blood sugar before surgery? . Check your blood sugar at least 4 times a day, starting 2 days before surgery, to make sure that the level is not too high or low. o Check your blood sugar the morning of your surgery when you wake up and every 2 hours until you get to the Short Stay unit. . If your blood sugar is less than 70 mg/dL, you will need to treat  for low blood sugar: o Do not take insulin. o Treat a low blood sugar (less than 70 mg/dL) with  cup of clear juice (cranberry or apple), 4 glucose tablets, OR glucose gel. o Recheck blood sugar in 15 minutes after treatment (to make sure it is greater than 70 mg/dL). If your blood sugar is not greater  than 70 mg/dL on recheck, call [phone] for further instructions. . Report your blood sugar to the short stay nurse when you get to Short Stay.  . If you are admitted to the hospital after surgery: o Your blood sugar will be checked by the staff and you will probably be given insulin after surgery (instead of oral diabetes medicines) to make sure you have good blood sugar levels. o The goal for blood sugar control after surgery is 80-180 mg/dL.   WHAT DO I DO ABOUT MY DIABETES MEDICATION?  Marland Kitchen Do not take oral diabetes medicines (pills) the morning of surgery.  . THE NIGHT BEFORE SURGERY, take   70% of your 70/30   insulin. 31 units      . THE MORNING OF SURGERY, take  50%   of   Your 70/  30   Insulin.   22 units .                                   You may not have any metal on your body including hair pins and              piercings  Do not wear jewelry,  lotions, powders or perfumes, deodorant              Men may shave face and neck.   Do not bring valuables to the hospital. [place].  Contacts, dentures or bridgework may not be worn into surgery.                Please read over the following fact sheets you were given: _____________________________________________________________________          [facility] - Preparing for Surgery Before surgery, you can play an important role.  Because skin is not sterile, your skin needs to be as free of germs as possible.  You can reduce the number of germs on your skin by washing with CHG (chlorahexidine gluconate) soap before surgery.  CHG is an antiseptic cleaner which kills germs and bonds with the skin to continue killing germs even after washing. Please DO NOT use if you have an allergy to CHG or antibacterial soaps.  If your skin becomes reddened/irritated stop using the CHG and inform your nurse when you arrive at Short Stay. Do not shave (including legs and underarms) for at  least 48 hours prior to the first CHG shower.  You may shave your face/neck. Please follow these instructions carefully:  1.  Shower with CHG Soap the night before surgery and the  morning of Surgery.  2.  If you choose to wash your hair, wash your hair first as usual with your  normal  shampoo.  3.  After you shampoo, rinse your hair and body thoroughly to remove the  shampoo.                           4.  Use CHG as you would any other liquid soap.  You can apply chg directly  to the skin and wash                       Gently with a scrungie or clean washcloth.  5.  Apply the CHG Soap to your body ONLY FROM THE NECK DOWN.   Do not use on face/ open                           Wound or open sores. Avoid contact with eyes, ears mouth and genitals (private parts).                       Wash face,  Genitals (private parts) with your normal soap.             6.  Wash thoroughly, paying special attention to the area where your surgery  will be performed.  7.  Thoroughly rinse your body with warm water from the neck down.  8.  DO NOT shower/wash with your normal soap after using and rinsing off  the CHG Soap.                9.  Pat yourself dry with a clean towel.            10.  Wear clean pajamas.            11.  Place clean sheets on your bed the night of your first shower and do not  sleep with pets. Day of Surgery : Do not apply any lotions/deodorants the morning of surgery.  Please wear clean clothes to the hospital/surgery center.  FAILURE TO FOLLOW THESE INSTRUCTIONS MAY RESULT IN THE CANCELLATION OF YOUR SURGERY PATIENT SIGNATURE_________________________________  NURSE SIGNATURE__________________________________  ________________________________________________________________________

## 2019-03-11 ENCOUNTER — Other Ambulatory Visit: Payer: Self-pay

## 2019-03-11 ENCOUNTER — Encounter (HOSPITAL_COMMUNITY)
Admission: RE | Admit: 2019-03-11 | Discharge: 2019-03-11 | Disposition: A | Discharge status: Home / Self Care | Payer: Medicare Other | Source: Ambulatory Visit | Attending: Urology | Admitting: Urology

## 2019-03-11 ENCOUNTER — Encounter (HOSPITAL_COMMUNITY): Payer: Self-pay

## 2019-03-11 DIAGNOSIS — Z01818 Encounter for other preprocedural examination: Secondary | ICD-10-CM | POA: Insufficient documentation

## 2019-03-11 DIAGNOSIS — N281 Cyst of kidney, acquired: Secondary | ICD-10-CM | POA: Insufficient documentation

## 2019-03-11 DIAGNOSIS — N2889 Other specified disorders of kidney and ureter: Secondary | ICD-10-CM | POA: Diagnosis not present

## 2019-03-11 HISTORY — DX: Headache, unspecified: R51.9

## 2019-03-11 HISTORY — DX: Chronic kidney disease, stage 3 unspecified: N18.30

## 2019-03-11 HISTORY — DX: Other complications of anesthesia, initial encounter: T88.59XA

## 2019-03-11 LAB — BASIC METABOLIC PANEL
Anion gap: 8 (ref 5–15)
BUN: 31 mg/dL — ABNORMAL HIGH (ref 8–23)
CO2: 23 mmol/L (ref 22–32)
Calcium: 9.8 mg/dL (ref 8.9–10.3)
Chloride: 110 mmol/L (ref 98–111)
Creatinine, Ser: 2.14 mg/dL — ABNORMAL HIGH (ref 0.61–1.24)
GFR calc Af Amer: 33 mL/min — ABNORMAL LOW (ref >60)
GFR calc non Af Amer: 28 mL/min — ABNORMAL LOW (ref >60)
Glucose, Bld: 132 mg/dL — ABNORMAL HIGH (ref 70–99)
Potassium: 5.1 mmol/L (ref 3.5–5.1)
Sodium: 141 mmol/L (ref 135–145)

## 2019-03-11 LAB — CBC
HCT: 44.8 % (ref 39.0–52.0)
Hemoglobin: 14.3 g/dL (ref 13.0–17.0)
MCH: 28.9 pg (ref 26.0–34.0)
MCHC: 31.9 g/dL (ref 30.0–36.0)
MCV: 90.5 fL (ref 80.0–100.0)
Platelets: 230 10*3/uL (ref 150–400)
RBC: 4.95 MIL/uL (ref 4.22–5.81)
RDW: 13.6 % (ref 11.5–15.5)
WBC: 5.4 10*3/uL (ref 4.0–10.5)
nRBC: 0 % (ref 0.0–0.2)

## 2019-03-11 LAB — HEMOGLOBIN A1C
Hgb A1c MFr Bld: 6.7 % — ABNORMAL HIGH (ref 4.8–5.6)
Mean Plasma Glucose: 145.59 mg/dL

## 2019-03-11 LAB — GLUCOSE, CAPILLARY: Glucose-Capillary: 110 mg/dL — ABNORMAL HIGH (ref 70–99)

## 2019-03-11 NOTE — Progress Notes (Signed)
PCP - Jori Moll polite Cardiologist -   Chest x-ray -  EKG - 03-11-19 Stress Test -  ECHO -  Cardiac Cath -   Sleep Study -  CPAP -   Fasting Blood Sugar - 100's Checks Blood Sugar _____ times a day  Blood Thinner Instructions: Aspirin Instructions: Last Dose:  Anesthesia review: OSA no CPAP  Does not tolerate, Creatine 2.14  Patient denies shortness of breath, fever, cough and chest pain at PAT appointment   none   Patient verbalized understanding of instructions that were given to them at the PAT appointment. Patient was also instructed that they will need to review over the PAT instructions again at home before surgery. note

## 2019-03-12 ENCOUNTER — Other Ambulatory Visit (HOSPITAL_COMMUNITY)
Admission: RE | Admit: 2019-03-12 | Discharge: 2019-03-12 | Disposition: A | Discharge status: Home / Self Care | Payer: Medicare Other | Source: Ambulatory Visit | Attending: Urology | Admitting: Urology

## 2019-03-12 DIAGNOSIS — Z20828 Contact with and (suspected) exposure to other viral communicable diseases: Secondary | ICD-10-CM | POA: Diagnosis not present

## 2019-03-12 DIAGNOSIS — Z01812 Encounter for preprocedural laboratory examination: Secondary | ICD-10-CM | POA: Insufficient documentation

## 2019-03-13 LAB — SARS CORONAVIRUS 2 (TAT 6-24 HRS): SARS Coronavirus 2: NEGATIVE

## 2019-03-15 ENCOUNTER — Encounter (HOSPITAL_COMMUNITY): Payer: Self-pay

## 2019-03-15 NOTE — Progress Notes (Signed)
Anesthesia Chart Review   Case: [id number] Date/Time: 03/16/19 0800   Procedure: XI ROBOTIC ADRENALECTOMY (Right ) - 3 HRS   Anesthesia type: General   Pre-op diagnosis: RIGHT ADRENAL MASSAND RIGHT RENAL CYST   Location: [place] 03 / [facility] ORS   Surgeon: Alexis Frock, MD      DISCUSSION:83 y.o. former smoker (15 pack years, quit 05/05/86) with h/o HTN, sleep apnea (cannot tolerate CPAP), DM II, CKD Stage III, right adrenal mass and right renal cyst scheduled for above procedure 03/16/2019 with Dr. Alexis Frock.   Pt with CKD Stage III, last labs 05/03/2018 with Creatinine 2.12 at this time.  2.14 at PAT visit; stable per PCP.    Anticipate pt can proceed with planned procedure barring acute status change.   VS: BP (!) 144/73 (BP Location: Right Arm)   Pulse 91   Temp 36.7 C (Oral)   Resp 18   Ht 5\' 10"  (1.778 m)   Wt 111.2 kg   SpO2 99%   BMI 35.17 kg/m   PROVIDERS: Seward Carol, MD is PCP    LABS: Labs reviewed: Acceptable for surgery. (all labs ordered are listed, but only abnormal results are displayed)  Labs Reviewed  HEMOGLOBIN A1C - Abnormal; Notable for the following components:      Result Value   Hgb A1c MFr Bld 6.7 (*)    All other components within normal limits  BASIC METABOLIC PANEL - Abnormal; Notable for the following components:   Glucose, Bld 132 (*)    BUN 31 (*)    Creatinine, Ser 2.14 (*)    GFR calc non Af Amer 28 (*)    GFR calc Af Amer 33 (*)    All other components within normal limits  GLUCOSE, CAPILLARY - Abnormal; Notable for the following components:   Glucose-Capillary 110 (*)    All other components within normal limits  CBC     IMAGES:   EKG: 03/11/2019 Rate 89 bpm Sinus rhythm with Premature atrial complexes Left posterior fascicular block Cannot rule out Anterior infarct , age undetermined Abnormal ECG No previous tracing  CV:  Past Medical History:  Diagnosis Date  . CKD (chronic kidney disease), stage III   .  Complication of anesthesia    Oxygen gets low when under anesthesia  . Diabetes ([place])    Has to be on insulin pancrease exploded due to pancreatitis   . Headache    hx of migraine  . Hypertension   . Sleep apnea    does not tolerate cpap    Past Surgical History:  Procedure Laterality Date  . L shoulder surgery     muscle repair  . reflux surgery    . TONSILLECTOMY      MEDICATIONS: . acetaminophen (TYLENOL) 325 MG tablet  . amLODipine (NORVASC) 5 MG tablet  . aspirin EC 81 MG tablet  . carboxymethylcellulose (REFRESH PLUS) 0.5 % SOLN  . HUMULIN 70/30 (70-30) 100 UNIT/ML injection  . irbesartan (AVAPRO) 300 MG tablet   No current facility-administered medications for this encounter.      [address] Plan [facility] Pre-Surgical Testing [phone] 03/15/19 11:29 AM

## 2019-03-15 NOTE — Anesthesia Preprocedure Evaluation (Addendum)
Anesthesia Evaluation  Patient identified by MRN, date of birth, ID band Patient awake    Reviewed: Allergy & Precautions, NPO status , Patient's Chart, lab work & pertinent test results  History of Anesthesia Complications Negative for: history of anesthetic complications  Airway Mallampati: I  TM Distance: >3 FB Neck ROM: Full    Dental  (+) Missing, Poor Dentition   Pulmonary sleep apnea , former smoker,    Pulmonary exam normal        Cardiovascular hypertension, Pt. on medications Normal cardiovascular exam     Neuro/Psych negative neurological ROS  negative psych ROS   GI/Hepatic negative GI ROS, Neg liver ROS,   Endo/Other  diabetes, Well Controlled, Insulin Dependent  Renal/GU Renal InsufficiencyRenal disease  negative genitourinary   Musculoskeletal negative musculoskeletal ROS (+)   Abdominal   Peds  Hematology negative hematology ROS (+)   Anesthesia Other Findings Adrenal mass  Reproductive/Obstetrics                            Anesthesia Physical Anesthesia Plan  ASA: III  Anesthesia Plan: General   Post-op Pain Management:    Induction: Intravenous  PONV Risk Score and Plan: 3 and Ondansetron, Dexamethasone and Treatment may vary due to age or medical condition  Airway Management Planned: Oral ETT  Additional Equipment: None  Intra-op Plan:   Post-operative Plan: Extubation in OR  Informed Consent: I have reviewed the patients History and Physical, chart, labs and discussed the procedure including the risks, benefits and alternatives for the proposed anesthesia with the patient or authorized representative who has indicated his/her understanding and acceptance.     Dental advisory given  Plan Discussed with:   Anesthesia Plan Comments: (PIV x2)       Anesthesia Quick Evaluation

## 2019-03-16 ENCOUNTER — Other Ambulatory Visit: Payer: Self-pay

## 2019-03-16 ENCOUNTER — Inpatient Hospital Stay (HOSPITAL_COMMUNITY)
Admission: RE | Admit: 2019-03-16 | Discharge: 2019-03-17 | DRG: 615 | Disposition: A | Discharge status: Home / Self Care | Payer: Medicare Other | Source: Other Acute Inpatient Hospital | Attending: Urology | Admitting: Urology

## 2019-03-16 ENCOUNTER — Inpatient Hospital Stay (HOSPITAL_COMMUNITY): Payer: Medicare Other | Admitting: Anesthesiology

## 2019-03-16 ENCOUNTER — Encounter (HOSPITAL_COMMUNITY)
Admission: RE | Disposition: A | Discharge status: Home / Self Care | Payer: Self-pay | Source: Other Acute Inpatient Hospital | Attending: Urology

## 2019-03-16 ENCOUNTER — Encounter (HOSPITAL_COMMUNITY): Payer: Self-pay | Admitting: General Practice

## 2019-03-16 ENCOUNTER — Inpatient Hospital Stay (HOSPITAL_COMMUNITY): Payer: Medicare Other | Admitting: Physician Assistant

## 2019-03-16 DIAGNOSIS — Z8249 Family history of ischemic heart disease and other diseases of the circulatory system: Secondary | ICD-10-CM | POA: Diagnosis not present

## 2019-03-16 DIAGNOSIS — N281 Cyst of kidney, acquired: Secondary | ICD-10-CM | POA: Diagnosis present

## 2019-03-16 DIAGNOSIS — Z79899 Other long term (current) drug therapy: Secondary | ICD-10-CM

## 2019-03-16 DIAGNOSIS — Z7982 Long term (current) use of aspirin: Secondary | ICD-10-CM | POA: Diagnosis not present

## 2019-03-16 DIAGNOSIS — E1142 Type 2 diabetes mellitus with diabetic polyneuropathy: Secondary | ICD-10-CM | POA: Diagnosis present

## 2019-03-16 DIAGNOSIS — G4733 Obstructive sleep apnea (adult) (pediatric): Secondary | ICD-10-CM | POA: Diagnosis present

## 2019-03-16 DIAGNOSIS — N200 Calculus of kidney: Secondary | ICD-10-CM | POA: Diagnosis present

## 2019-03-16 DIAGNOSIS — R972 Elevated prostate specific antigen [PSA]: Secondary | ICD-10-CM | POA: Diagnosis present

## 2019-03-16 DIAGNOSIS — I129 Hypertensive chronic kidney disease with stage 1 through stage 4 chronic kidney disease, or unspecified chronic kidney disease: Secondary | ICD-10-CM | POA: Diagnosis present

## 2019-03-16 DIAGNOSIS — Z794 Long term (current) use of insulin: Secondary | ICD-10-CM

## 2019-03-16 DIAGNOSIS — N1832 Chronic kidney disease, stage 3b: Secondary | ICD-10-CM | POA: Diagnosis present

## 2019-03-16 DIAGNOSIS — D3501 Benign neoplasm of right adrenal gland: Principal | ICD-10-CM | POA: Diagnosis present

## 2019-03-16 DIAGNOSIS — Z87891 Personal history of nicotine dependence: Secondary | ICD-10-CM

## 2019-03-16 DIAGNOSIS — E279 Disorder of adrenal gland, unspecified: Secondary | ICD-10-CM | POA: Diagnosis present

## 2019-03-16 DIAGNOSIS — Z825 Family history of asthma and other chronic lower respiratory diseases: Secondary | ICD-10-CM

## 2019-03-16 DIAGNOSIS — E669 Obesity, unspecified: Secondary | ICD-10-CM | POA: Diagnosis present

## 2019-03-16 DIAGNOSIS — Z6835 Body mass index (BMI) 35.0-35.9, adult: Secondary | ICD-10-CM | POA: Diagnosis not present

## 2019-03-16 DIAGNOSIS — Z20828 Contact with and (suspected) exposure to other viral communicable diseases: Secondary | ICD-10-CM | POA: Diagnosis present

## 2019-03-16 DIAGNOSIS — E1122 Type 2 diabetes mellitus with diabetic chronic kidney disease: Secondary | ICD-10-CM | POA: Diagnosis present

## 2019-03-16 DIAGNOSIS — E278 Other specified disorders of adrenal gland: Secondary | ICD-10-CM | POA: Diagnosis present

## 2019-03-16 HISTORY — PX: ROBOTIC ADRENALECTOMY: SHX6407

## 2019-03-16 LAB — BASIC METABOLIC PANEL
Anion gap: 11 (ref 5–15)
BUN: 33 mg/dL — ABNORMAL HIGH (ref 8–23)
CO2: 23 mmol/L (ref 22–32)
Calcium: 9.3 mg/dL (ref 8.9–10.3)
Chloride: 102 mmol/L (ref 98–111)
Creatinine, Ser: 2.52 mg/dL — ABNORMAL HIGH (ref 0.61–1.24)
GFR calc Af Amer: 27 mL/min — ABNORMAL LOW (ref >60)
GFR calc non Af Amer: 23 mL/min — ABNORMAL LOW (ref >60)
Glucose, Bld: 196 mg/dL — ABNORMAL HIGH (ref 70–99)
Potassium: 4.8 mmol/L (ref 3.5–5.1)
Sodium: 136 mmol/L (ref 135–145)

## 2019-03-16 LAB — GLUCOSE, CAPILLARY
Glucose-Capillary: 102 mg/dL — ABNORMAL HIGH (ref 70–99)
Glucose-Capillary: 160 mg/dL — ABNORMAL HIGH (ref 70–99)
Glucose-Capillary: 189 mg/dL — ABNORMAL HIGH (ref 70–99)

## 2019-03-16 LAB — TYPE AND SCREEN
ABO/RH(D): B POS
Antibody Screen: NEGATIVE

## 2019-03-16 LAB — HEMOGLOBIN AND HEMATOCRIT, BLOOD
HCT: 42.9 % (ref 39.0–52.0)
Hemoglobin: 13.6 g/dL (ref 13.0–17.0)

## 2019-03-16 LAB — ABO/RH: ABO/RH(D): B POS

## 2019-03-16 SURGERY — ADRENALECTOMY, ROBOT-ASSISTED
Anesthesia: General | Laterality: Right

## 2019-03-16 MED ORDER — LACTATED RINGERS IR SOLN
Status: DC | PRN
  Administered 2019-03-16: 1000 mL

## 2019-03-16 MED ORDER — ONDANSETRON HCL 4 MG/2ML IJ SOLN: 4.0000 mg | INTRAMUSCULAR | Status: DC | PRN

## 2019-03-16 MED ORDER — PHENYLEPHRINE HCL (PRESSORS) 10 MG/ML IV SOLN
INTRAVENOUS | Status: AC
  Filled 2019-03-16: qty 1

## 2019-03-16 MED ORDER — SUCCINYLCHOLINE CHLORIDE 200 MG/10ML IV SOSY
PREFILLED_SYRINGE | INTRAVENOUS | Status: DC | PRN
  Administered 2019-03-16: 120 mg via INTRAVENOUS

## 2019-03-16 MED ORDER — CEFAZOLIN SODIUM-DEXTROSE 2-4 GM/100ML-% IV SOLN
2.0000 g | INTRAVENOUS | Status: AC
  Administered 2019-03-16: 2 g via INTRAVENOUS
  Filled 2019-03-16: qty 100

## 2019-03-16 MED ORDER — SUGAMMADEX SODIUM 500 MG/5ML IV SOLN
INTRAVENOUS | Status: AC
  Filled 2019-03-16: qty 5

## 2019-03-16 MED ORDER — LIDOCAINE 2% (20 MG/ML) 5 ML SYRINGE
INTRAMUSCULAR | Status: DC | PRN
  Administered 2019-03-16: 80 mg via INTRAVENOUS

## 2019-03-16 MED ORDER — CHLORHEXIDINE GLUCONATE CLOTH 2 % EX PADS: 6.0000 | MEDICATED_PAD | Freq: Every day | CUTANEOUS | Status: DC

## 2019-03-16 MED ORDER — DEXAMETHASONE SODIUM PHOSPHATE 10 MG/ML IJ SOLN
INTRAMUSCULAR | Status: AC
  Filled 2019-03-16: qty 1

## 2019-03-16 MED ORDER — OXYCODONE HCL 5 MG PO TABS: 5.0000 mg | ORAL_TABLET | ORAL | Status: DC | PRN

## 2019-03-16 MED ORDER — AMLODIPINE BESYLATE 5 MG PO TABS
5.0000 mg | ORAL_TABLET | Freq: Every day | ORAL | Status: DC
  Administered 2019-03-17: 5 mg via ORAL
  Filled 2019-03-16: qty 1

## 2019-03-16 MED ORDER — OXYCODONE HCL 5 MG PO TABS: 5.0000 mg | ORAL_TABLET | Freq: Once | ORAL | Status: DC | PRN

## 2019-03-16 MED ORDER — DIPHENHYDRAMINE HCL 50 MG/ML IJ SOLN: 12.5000 mg | Freq: Four times a day (QID) | INTRAMUSCULAR | Status: DC | PRN

## 2019-03-16 MED ORDER — ROCURONIUM BROMIDE 10 MG/ML (PF) SYRINGE
PREFILLED_SYRINGE | INTRAVENOUS | Status: AC
  Filled 2019-03-16: qty 10

## 2019-03-16 MED ORDER — MAGNESIUM CITRATE PO SOLN: 1.0000 | Freq: Once | ORAL | Status: DC

## 2019-03-16 MED ORDER — LIDOCAINE 2% (20 MG/ML) 5 ML SYRINGE
INTRAMUSCULAR | Status: AC
  Filled 2019-03-16: qty 5

## 2019-03-16 MED ORDER — SENNOSIDES-DOCUSATE SODIUM 8.6-50 MG PO TABS: 1.0000 | ORAL_TABLET | Freq: Every evening | ORAL | Status: DC | PRN

## 2019-03-16 MED ORDER — SUCCINYLCHOLINE CHLORIDE 200 MG/10ML IV SOSY
PREFILLED_SYRINGE | INTRAVENOUS | Status: AC
  Filled 2019-03-16: qty 10

## 2019-03-16 MED ORDER — POLYVINYL ALCOHOL 1.4 % OP SOLN
1.0000 [drp] | Freq: Three times a day (TID) | OPHTHALMIC | Status: DC | PRN
  Filled 2019-03-16: qty 15

## 2019-03-16 MED ORDER — DOCUSATE SODIUM 100 MG PO CAPS
100.0000 mg | ORAL_CAPSULE | Freq: Two times a day (BID) | ORAL | Status: DC
  Administered 2019-03-16 – 2019-03-17 (×2): 100 mg via ORAL
  Filled 2019-03-16 (×2): qty 1

## 2019-03-16 MED ORDER — SUGAMMADEX SODIUM 200 MG/2ML IV SOLN
INTRAVENOUS | Status: DC | PRN
  Administered 2019-03-16: 500 mg via INTRAVENOUS

## 2019-03-16 MED ORDER — ONDANSETRON HCL 4 MG/2ML IJ SOLN
INTRAMUSCULAR | Status: AC
  Filled 2019-03-16: qty 2

## 2019-03-16 MED ORDER — MORPHINE SULFATE (PF) 2 MG/ML IV SOLN
2.0000 mg | INTRAVENOUS | Status: DC | PRN
  Administered 2019-03-16: 2 mg via INTRAVENOUS
  Filled 2019-03-16: qty 1

## 2019-03-16 MED ORDER — SODIUM CHLORIDE (PF) 0.9 % IJ SOLN
INTRAMUSCULAR | Status: AC
  Filled 2019-03-16: qty 20

## 2019-03-16 MED ORDER — LACTATED RINGERS IV SOLN
INTRAVENOUS | Status: DC
  Administered 2019-03-16 (×2): via INTRAVENOUS

## 2019-03-16 MED ORDER — SODIUM CHLORIDE (PF) 0.9 % IJ SOLN
INTRAMUSCULAR | Status: DC | PRN
  Administered 2019-03-16: 20 mL

## 2019-03-16 MED ORDER — FENTANYL CITRATE (PF) 250 MCG/5ML IJ SOLN
INTRAMUSCULAR | Status: AC
  Filled 2019-03-16: qty 5

## 2019-03-16 MED ORDER — FENTANYL CITRATE (PF) 100 MCG/2ML IJ SOLN
INTRAMUSCULAR | Status: AC
  Filled 2019-03-16: qty 2

## 2019-03-16 MED ORDER — INSULIN ASPART 100 UNIT/ML ~~LOC~~ SOLN
0.0000 [IU] | Freq: Three times a day (TID) | SUBCUTANEOUS | Status: DC
  Administered 2019-03-16: 3 [IU] via SUBCUTANEOUS
  Administered 2019-03-17: 2 [IU] via SUBCUTANEOUS

## 2019-03-16 MED ORDER — PROPOFOL 10 MG/ML IV BOLUS
INTRAVENOUS | Status: AC
  Filled 2019-03-16: qty 20

## 2019-03-16 MED ORDER — ROCURONIUM BROMIDE 10 MG/ML (PF) SYRINGE
PREFILLED_SYRINGE | INTRAVENOUS | Status: DC | PRN
  Administered 2019-03-16: 60 mg via INTRAVENOUS
  Administered 2019-03-16: 20 mg via INTRAVENOUS

## 2019-03-16 MED ORDER — INSULIN ASPART PROT & ASPART (70-30 MIX) 100 UNIT/ML ~~LOC~~ SUSP
20.0000 [IU] | Freq: Two times a day (BID) | SUBCUTANEOUS | Status: DC
  Administered 2019-03-16 – 2019-03-17 (×2): 20 [IU] via SUBCUTANEOUS
  Filled 2019-03-16: qty 10

## 2019-03-16 MED ORDER — PROPOFOL 10 MG/ML IV BOLUS
INTRAVENOUS | Status: DC | PRN
  Administered 2019-03-16: 130 mg via INTRAVENOUS

## 2019-03-16 MED ORDER — PHENYLEPHRINE HCL-NACL 10-0.9 MG/250ML-% IV SOLN
INTRAVENOUS | Status: DC | PRN
  Administered 2019-03-16: 25 ug/min via INTRAVENOUS

## 2019-03-16 MED ORDER — FENTANYL CITRATE (PF) 100 MCG/2ML IJ SOLN
INTRAMUSCULAR | Status: DC | PRN
  Administered 2019-03-16 (×2): 50 ug via INTRAVENOUS

## 2019-03-16 MED ORDER — LACTATED RINGERS IV SOLN
INTRAVENOUS | Status: DC
  Administered 2019-03-16: 07:00:00 via INTRAVENOUS

## 2019-03-16 MED ORDER — FENTANYL CITRATE (PF) 100 MCG/2ML IJ SOLN
25.0000 ug | INTRAMUSCULAR | Status: DC | PRN
  Administered 2019-03-16: 50 ug via INTRAVENOUS

## 2019-03-16 MED ORDER — SODIUM CHLORIDE 0.45 % IV SOLN
INTRAVENOUS | Status: DC
  Administered 2019-03-16 – 2019-03-17 (×2): via INTRAVENOUS

## 2019-03-16 MED ORDER — LABETALOL HCL 5 MG/ML IV SOLN: 10.0000 mg | INTRAVENOUS | Status: DC | PRN

## 2019-03-16 MED ORDER — OXYCODONE HCL 5 MG/5ML PO SOLN: 5.0000 mg | Freq: Once | ORAL | Status: DC | PRN

## 2019-03-16 MED ORDER — BUPIVACAINE LIPOSOME 1.3 % IJ SUSP
20.0000 mL | Freq: Once | INTRAMUSCULAR | Status: AC
  Administered 2019-03-16: 20 mL
  Filled 2019-03-16: qty 20

## 2019-03-16 MED ORDER — FENTANYL CITRATE (PF) 250 MCG/5ML IJ SOLN
INTRAMUSCULAR | Status: DC | PRN
  Administered 2019-03-16 (×3): 50 ug via INTRAVENOUS
  Administered 2019-03-16: 100 ug via INTRAVENOUS

## 2019-03-16 MED ORDER — ONDANSETRON HCL 4 MG/2ML IJ SOLN: 4.0000 mg | Freq: Once | INTRAMUSCULAR | Status: DC | PRN

## 2019-03-16 MED ORDER — ONDANSETRON HCL 4 MG/2ML IJ SOLN
INTRAMUSCULAR | Status: DC | PRN
  Administered 2019-03-16: 4 mg via INTRAVENOUS

## 2019-03-16 MED ORDER — DEXAMETHASONE SODIUM PHOSPHATE 10 MG/ML IJ SOLN
INTRAMUSCULAR | Status: DC | PRN
  Administered 2019-03-16: 10 mg via INTRAVENOUS

## 2019-03-16 MED ORDER — STERILE WATER FOR IRRIGATION IR SOLN
Status: DC | PRN
  Administered 2019-03-16: 1000 mL

## 2019-03-16 MED ORDER — DIPHENHYDRAMINE HCL 12.5 MG/5ML PO ELIX: 12.5000 mg | ORAL_SOLUTION | Freq: Four times a day (QID) | ORAL | Status: DC | PRN

## 2019-03-16 MED ORDER — ACETAMINOPHEN 500 MG PO TABS
1000.0000 mg | ORAL_TABLET | Freq: Four times a day (QID) | ORAL | Status: DC
  Administered 2019-03-16 – 2019-03-17 (×4): 1000 mg via ORAL
  Filled 2019-03-16 (×4): qty 2

## 2019-03-16 SURGICAL SUPPLY — 64 items
BAG LAPAROSCOPIC 12 15 PORT 16 (BASKET) ×1 IMPLANT
BAG RETRIEVAL 12/15 (BASKET) ×2
BAG RETRIEVAL 12/15MM (BASKET) ×1
CHLORAPREP W/TINT 26 (MISCELLANEOUS) ×3 IMPLANT
CLIP VESOLOCK LG 6/CT PURPLE (CLIP) ×3 IMPLANT
CLIP VESOLOCK MED LG 6/CT (CLIP) ×3 IMPLANT
CLIP VESOLOCK XL 6/CT (CLIP) ×3 IMPLANT
COVER SURGICAL LIGHT HANDLE (MISCELLANEOUS) ×3 IMPLANT
COVER TIP SHEARS 8 DVNC (MISCELLANEOUS) ×1 IMPLANT
COVER TIP SHEARS 8MM DA VINCI (MISCELLANEOUS) ×2
COVER WAND RF STERILE (DRAPES) ×3 IMPLANT
CUTTER ECHEON FLEX ENDO 45 340 (ENDOMECHANICALS) ×3 IMPLANT
DECANTER SPIKE VIAL GLASS SM (MISCELLANEOUS) ×3 IMPLANT
DERMABOND ADVANCED (GAUZE/BANDAGES/DRESSINGS) ×2
DERMABOND ADVANCED .7 DNX12 (GAUZE/BANDAGES/DRESSINGS) ×1 IMPLANT
DRAIN CHANNEL 15F RND FF 3/16 (WOUND CARE) ×3 IMPLANT
DRAPE ARM DVNC X/XI (DISPOSABLE) ×4 IMPLANT
DRAPE COLUMN DVNC XI (DISPOSABLE) ×1 IMPLANT
DRAPE DA VINCI XI ARM (DISPOSABLE) ×8
DRAPE DA VINCI XI COLUMN (DISPOSABLE) ×2
DRAPE INCISE IOBAN 66X45 STRL (DRAPES) ×3 IMPLANT
DRAPE LAPAROSCOPIC ABDOMINAL (DRAPES) IMPLANT
DRAPE SHEET LG 3/4 BI-LAMINATE (DRAPES) ×3 IMPLANT
DRSG TEGADERM 4X4.75 (GAUZE/BANDAGES/DRESSINGS) ×3 IMPLANT
ELECT PENCIL ROCKER SW 15FT (MISCELLANEOUS) ×3 IMPLANT
ELECT REM PT RETURN 15FT ADLT (MISCELLANEOUS) ×3 IMPLANT
EVACUATOR SILICONE 100CC (DRAIN) ×3 IMPLANT
GAUZE SPONGE 2X2 8PLY STRL LF (GAUZE/BANDAGES/DRESSINGS) ×1 IMPLANT
GLOVE BIO SURGEON STRL SZ 6.5 (GLOVE) ×2 IMPLANT
GLOVE BIO SURGEONS STRL SZ 6.5 (GLOVE) ×1
GLOVE BIOGEL M STRL SZ7.5 (GLOVE) ×6 IMPLANT
GOWN STRL REUS W/TWL LRG LVL3 (GOWN DISPOSABLE) ×9 IMPLANT
IRRIG SUCT STRYKERFLOW 2 WTIP (MISCELLANEOUS) ×3
IRRIGATION SUCT STRKRFLW 2 WTP (MISCELLANEOUS) ×1 IMPLANT
KIT BASIN OR (CUSTOM PROCEDURE TRAY) ×3 IMPLANT
KIT TURNOVER KIT A (KITS) IMPLANT
LOOP VESSEL MAXI BLUE (MISCELLANEOUS) IMPLANT
NEEDLE INSUFFLATION 14GA 120MM (NEEDLE) ×3 IMPLANT
PORT ACCESS TROCAR AIRSEAL 12 (TROCAR) ×1 IMPLANT
PORT ACCESS TROCAR AIRSEAL 5M (TROCAR) ×2
POUCH SPECIMEN RETRIEVAL 10MM (ENDOMECHANICALS) ×3 IMPLANT
PROTECTOR NERVE ULNAR (MISCELLANEOUS) ×9 IMPLANT
SEAL CANN UNIV 5-8 DVNC XI (MISCELLANEOUS) ×4 IMPLANT
SEAL XI 5MM-8MM UNIVERSAL (MISCELLANEOUS) ×8
SET BI-LUMEN FLTR TB AIRSEAL (TUBING) ×3 IMPLANT
SOLUTION ELECTROLUBE (MISCELLANEOUS) ×3 IMPLANT
SPONGE GAUZE 2X2 STER 10/PKG (GAUZE/BANDAGES/DRESSINGS) ×2
SPONGE LAP 4X18 RFD (DISPOSABLE) ×3 IMPLANT
STAPLE RELOAD 45 WHT (STAPLE) ×3 IMPLANT
STAPLE RELOAD 45MM WHITE (STAPLE) ×6
SUT ETHILON 3 0 PS 1 (SUTURE) ×3 IMPLANT
SUT MNCRL AB 4-0 PS2 18 (SUTURE) ×6 IMPLANT
SUT PDS AB 1 CT1 27 (SUTURE) ×9 IMPLANT
SUT VIC AB 2-0 SH 27 (SUTURE) ×2
SUT VIC AB 2-0 SH 27X BRD (SUTURE) ×1 IMPLANT
SUT VICRYL 0 UR6 27IN ABS (SUTURE) IMPLANT
TOWEL OR 17X26 10 PK STRL BLUE (TOWEL DISPOSABLE) ×3 IMPLANT
TOWEL OR NON WOVEN STRL DISP B (DISPOSABLE) ×3 IMPLANT
TRAY FOLEY MTR SLVR 16FR STAT (SET/KITS/TRAYS/PACK) ×3 IMPLANT
TRAY LAPAROSCOPIC (CUSTOM PROCEDURE TRAY) ×3 IMPLANT
TROCAR BLADELESS OPT 5 100 (ENDOMECHANICALS) ×3 IMPLANT
TROCAR UNIVERSAL OPT 12M 100M (ENDOMECHANICALS) ×3 IMPLANT
TROCAR XCEL 12X100 BLDLESS (ENDOMECHANICALS) ×3 IMPLANT
WATER STERILE IRR 1000ML POUR (IV SOLUTION) ×3 IMPLANT

## 2019-03-16 NOTE — Anesthesia Postprocedure Evaluation (Signed)
Anesthesia Post Note  Patient: Brandon Weaver  Procedure(s) Performed: XI ROBOTIC ADRENALECTOMY, RENAL CYST DECORTICATION AND NODE DISSECTION (Right )     Patient location during evaluation: PACU Anesthesia Type: General Level of consciousness: awake and alert Pain management: pain level controlled Vital Signs Assessment: post-procedure vital signs reviewed and stable Respiratory status: spontaneous breathing, nonlabored ventilation, respiratory function stable and patient connected to nasal cannula oxygen Cardiovascular status: blood pressure returned to baseline and stable Postop Assessment: no apparent nausea or vomiting Anesthetic complications: no    Last Vitals:  Vitals:   03/16/19 1200 03/16/19 1215  BP: (!) 157/81 (!) 146/80  Pulse: 86 86  Resp:  15  Temp:    SpO2: 100% 100%    Last Pain:  Vitals:   03/16/19 1215  TempSrc:   PainSc: 5                  Lidia Collum

## 2019-03-16 NOTE — Discharge Instructions (Signed)

## 2019-03-16 NOTE — H&P (Signed)
Brandon Weaver is an 83 y.o. male.    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 Endocrine eval with BMP, metanphrints, PRA negative, but he does get occasional hot flashes. BX 2019 with fibrosis / necrosis only. Extensive neovascularity including superior and enferior branches, medial hard to determine as some mass effect on IVC. Ipsilateral 1 artery 1 vein renovascular anatomy.   2 - Bilateral Non-Complex Renal Cysts - scattered bilateral cysts w/o enhancement on imagign x many. Dominant cyst 5.4 cm RLP with some extension into pelvis.   3 - Right Renal Stone - 1cm Rt lower mid infunsibular stone w/o hydro and stable on imaging x many.   4 - Elevated PSA - PSA 6-8 range x many. NEGATIVE Biopsy 2019.   PMH sig for open nissen (large midline scar), obesity, IDDM2 (mild neuropathy). No ischemic CV disese / blood thinners. 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 His PCP is Seward Carol MD.   Today "Brandon Weaver" is seen to proceed with RIGHT robotic adrenalectomy for large adrenal mass that does not appear functional by endocrine eval.     Past Medical History:  Diagnosis Date  . CKD (chronic kidney disease), stage III   . Complication of anesthesia    Oxygen gets low when under anesthesia  . Diabetes ([place])    Has to be on insulin pancrease exploded due to pancreatitis   . Headache    hx of migraine  . Hypertension   . Sleep apnea    does not tolerate cpap    Past Surgical History:  Procedure Laterality Date  . L shoulder surgery     muscle repair  . reflux surgery    . TONSILLECTOMY      Family History  Problem Relation Age of Onset  . COPD Brother   . COPD Mother   . Heart disease Mother   . Sleep apnea Son   . Cancer - Prostate Brother    Social History:  reports that he quit smoking about 32 years ago. His smoking use included  cigarettes. He has a 15.00 pack-year smoking history. He has never used smokeless tobacco. He reports previous alcohol use. He reports that he does not use drugs.  Allergies: No Known Allergies  Medications Prior to Admission  Medication Sig Dispense Refill  . acetaminophen (TYLENOL) 325 MG tablet Take 650 mg by mouth every 6 (six) hours as needed (pain.).     Marland Kitchen amLODipine (NORVASC) 5 MG tablet Take 5 mg by mouth daily.    Marland Kitchen aspirin EC 81 MG tablet Take 81 mg by mouth daily.    . carboxymethylcellulose (REFRESH PLUS) 0.5 % SOLN Place 1-2 drops into both eyes 3 (three) times daily as needed (dry/irritated eyes.).    Marland Kitchen HUMULIN 70/30 (70-30) 100 UNIT/ML injection Inject 45 Units into the skin 2 (two) times daily.    . irbesartan (AVAPRO) 300 MG tablet Take 300 mg by mouth daily.      No results found for this or any previous visit (from the past 48 hour(s)). No results found.  Review of Systems  Constitutional: Negative.  Negative for chills and fever.  HENT: Negative.   Eyes: Negative.   Respiratory: Negative.   Cardiovascular: Negative.   Gastrointestinal: Negative.   Genitourinary: Negative for flank pain.  Musculoskeletal: Negative.   Skin: Negative.  Neurological: Negative.   Endo/Heme/Allergies: Negative.   Psychiatric/Behavioral: Negative.     Blood pressure (!) 146/84, pulse 96, temperature 98.5 F (36.9 C), temperature source Oral, resp. rate 18, SpO2 99 %. Physical Exam  Constitutional: He appears well-developed.  HENT:  Head: Normocephalic.  Neck: Normal range of motion.  Cardiovascular: Normal rate.  Respiratory: Effort normal.  GI:  Stable truncal obesity and prior scars w/o hernias.   Genitourinary:    Genitourinary Comments: No cVAT   Musculoskeletal: Normal range of motion.  Neurological: He is alert.  Skin: Skin is warm.  Psychiatric: He has a normal mood and affect.     Assessment/Plan  Proceed as planned with RIGHT robotic adrenalectomy / cyst  decortication. Risks, benefits, alternatives, expected peri-op course discussed previously and reiterated today.   Alexis Frock, MD 03/16/2019, 6:45 AM

## 2019-03-16 NOTE — Progress Notes (Signed)
Urology Progress Note   Subjective: Transferred to the floor.  Feeling well. Pain controlled, no nausea. "Just a little sore" Urine clear yellow JP with scant SS output  Objective: Vital signs in last 24 hours: Temp:  [97.8 F (36.6 C)-98.5 F (36.9 C)] 98.1 F (36.7 C) (11/11 1357) Pulse Rate:  [83-96] 89 (11/11 1357) Resp:  [12-20] 20 (11/11 1357) BP: (116-170)/(74-102) 168/92 (11/11 1357) SpO2:  [99 %-100 %] 100 % (11/11 1357) Weight:  [111.2 kg] 111.2 kg (11/11 0700)  Intake/Output from previous day: No intake/output data recorded. Intake/Output this shift: Total I/O In: 1700 [I.V.:1600; IV Piggyback:100] Out: 265 [Urine:105; Drains:35; Blood:125]  Physical Exam:  General: Alert and oriented CV: Regular rate Lungs: No increased work of breathing Abdomen: Soft, appropriately tender. Incisions c/d/i. JP SS GU: Foley in place draining clear yellow urine  Ext: NT, No erythema  Lab Results: Recent Labs    03/16/19 1222  HGB 13.6  HCT 42.9   Recent Labs    03/16/19 1222  NA 136  K 4.8  CL 102  CO2 23  GLUCOSE 196*  BUN 33*  CREATININE 2.52*  CALCIUM 9.3    Studies/Results: No results found.  Assessment/Plan:  83 y.o. male s/p right adrenalectomy and right lower pole renal cyst decortication.  Overall doing well post-op.   - Walk this evening - CLD - Maintain Foley until morning - Monitor I/Os including JP output - Monitor blood sugar - Wean O2 as tolerated, has OSA but does not tolerate CPAP so may need O2 this evening.    LOS: 0 days

## 2019-03-16 NOTE — Transfer of Care (Signed)
Immediate Anesthesia Transfer of Care Note  Patient: Brandon Weaver  Procedure(s) Performed: XI ROBOTIC ADRENALECTOMY, RENAL CYST DECORTICATION AND NODE DISSECTION (Right )  Patient Location: PACU  Anesthesia Type:General  Level of Consciousness: awake, alert , oriented and patient cooperative  Airway & Oxygen Therapy: Patient Spontanous Breathing and Patient connected to face mask oxygen  Post-op Assessment: Report given to RN, Post -op Vital signs reviewed and stable and Patient moving all extremities  Post vital signs: Reviewed and stable  Last Vitals:  Vitals Value Taken Time  BP    Temp    Pulse    Resp    SpO2      Last Pain:  Vitals:   03/16/19 0700  TempSrc:   PainSc: 0-No pain         Complications: No apparent anesthesia complications

## 2019-03-16 NOTE — Anesthesia Procedure Notes (Signed)
Procedure Name: Intubation Date/Time: 03/16/2019 8:36 AM Performed by: Mitzie Na, CRNA Pre-anesthesia Checklist: Patient identified, Emergency Drugs available, Suction available and Patient being monitored Patient Re-evaluated:Patient Re-evaluated prior to induction Oxygen Delivery Method: Circle system utilized Preoxygenation: Pre-oxygenation with 100% oxygen Induction Type: IV induction and Rapid sequence Laryngoscope Size: Mac and 4 Grade View: Grade II Tube type: Oral Tube size: 7.5 mm Number of attempts: 1 Airway Equipment and Method: Stylet and Oral airway Placement Confirmation: ETT inserted through vocal cords under direct vision,  positive ETCO2 and breath sounds checked- equal and bilateral Secured at: 24 cm Tube secured with: Tape Dental Injury: Teeth and Oropharynx as per pre-operative assessment

## 2019-03-16 NOTE — Brief Op Note (Signed)
03/16/2019  11:31 AM  PATIENT:  Brandon Weaver  83 y.o. male  PRE-OPERATIVE DIAGNOSIS:  RIGHT ADRENAL MASSAND RIGHT RENAL CYST  POST-OPERATIVE DIAGNOSIS:  RIGHT ADRENAL MASSAND RIGHT RENAL CYST  PROCEDURE:  Procedure(s) with comments: XI ROBOTIC ADRENALECTOMY, RENAL CYST DECORTICATION AND NODE DISSECTION (Right) - 3 HRS  RETROPERITONEAL NODE DISSECTION  SURGEON:  Surgeon(s) and Role:    * Alexis Frock, MD - Primary  PHYSICIAN ASSISTANT:   ASSISTANTS: Sharlot Gowda MD   ANESTHESIA:   local and general  EBL:  100 mL   BLOOD ADMINISTERED:none  DRAINS: 1 - JP to bulb; 2 - foley to gravity   LOCAL MEDICATIONS USED:  MARCAINE     SPECIMEN:  Source of Specimen:  1 - Rt Renal cyst wall; 2 - Rt adrenal gland; 3 - retrocaval lymph node  DISPOSITION OF SPECIMEN:  PATHOLOGY  COUNTS:  YES  TOURNIQUET:  * No tourniquets in log *  DICTATION: .Other Dictation: Dictation Number  [id number]  PLAN OF CARE: Admit to inpatient   PATIENT DISPOSITION:  PACU - hemodynamically stable.   Delay start of Pharmacological VTE agent (>24hrs) due to surgical blood loss or risk of bleeding: yes

## 2019-03-17 ENCOUNTER — Encounter (HOSPITAL_COMMUNITY): Payer: Self-pay | Admitting: Urology

## 2019-03-17 LAB — BASIC METABOLIC PANEL
Anion gap: 8 (ref 5–15)
BUN: 34 mg/dL — ABNORMAL HIGH (ref 8–23)
CO2: 23 mmol/L (ref 22–32)
Calcium: 8.8 mg/dL — ABNORMAL LOW (ref 8.9–10.3)
Chloride: 102 mmol/L (ref 98–111)
Creatinine, Ser: 2.32 mg/dL — ABNORMAL HIGH (ref 0.61–1.24)
GFR calc Af Amer: 30 mL/min — ABNORMAL LOW (ref >60)
GFR calc non Af Amer: 26 mL/min — ABNORMAL LOW (ref >60)
Glucose, Bld: 163 mg/dL — ABNORMAL HIGH (ref 70–99)
Potassium: 5.3 mmol/L — ABNORMAL HIGH (ref 3.5–5.1)
Sodium: 133 mmol/L — ABNORMAL LOW (ref 135–145)

## 2019-03-17 LAB — GLUCOSE, CAPILLARY
Glucose-Capillary: 118 mg/dL — ABNORMAL HIGH (ref 70–99)
Glucose-Capillary: 148 mg/dL — ABNORMAL HIGH (ref 70–99)
Glucose-Capillary: 148 mg/dL — ABNORMAL HIGH (ref 70–99)

## 2019-03-17 LAB — HEMOGLOBIN AND HEMATOCRIT, BLOOD
HCT: 39.7 % (ref 39.0–52.0)
Hemoglobin: 12.5 g/dL — ABNORMAL LOW (ref 13.0–17.0)

## 2019-03-17 MED ORDER — SENNOSIDES-DOCUSATE SODIUM 8.6-50 MG PO TABS: 1.0000 | ORAL_TABLET | Freq: Two times a day (BID) | ORAL | 0 refills | Status: DC

## 2019-03-17 MED ORDER — OXYCODONE-ACETAMINOPHEN 5-325 MG PO TABS: 1.0000 | ORAL_TABLET | Freq: Three times a day (TID) | ORAL | 0 refills | Status: DC | PRN

## 2019-03-17 NOTE — Progress Notes (Signed)
Patient remains A&Ox4, ambulatory. Discharge instructions reviewed. Questions concerns denied. Pt has all belongings

## 2019-03-17 NOTE — Progress Notes (Signed)
Foley catheter was discontinued. Pt has voided once 200 cc clear yellow urine.

## 2019-03-17 NOTE — Progress Notes (Signed)
Patient ambulated 180 feet. Activity tolerated well. Gait remained steady through out.

## 2019-03-17 NOTE — Op Note (Signed)
NAME: Brandon Weaver, Brandon Weaver MEDICAL RECORD [id number] ACCOUNT [id number] DATE OF BIRTH:1935-10-09 FACILITY: [facility] LOCATION:  PHYSICIAN:Stuti Sandin, MD  OPERATIVE REPORT  DATE OF PROCEDURE:  03/16/2019  PREOPERATIVE DIAGNOSES:  Right enlarging adrenal mass, right lower pole renal cyst.  PROCEDURE: 1.  Robotic-assisted laparoscopic right adrenalectomy. 2.  Robotic right renal cyst decortication. 3.  Right retroperitoneal lymph node dissection.  ESTIMATED BLOOD LOSS:  100 mL.  COMPLICATIONS:  None.  SPECIMENS: 1.  Right lower pole cyst wall permanent pathology. 2.  Right adrenal gland permanent pathology. 3.  Right retrocaval lymph node permanent pathology.  ASSISTANT:  Sharlot Gowda, MD  DRAINS:   1.  Jackson-Pratt drain to bulb suction. 2.  Foley catheter to straight drainage.   FINDINGS: 1.  Single artery, single vein, right renovascular anatomy. 2.  Simple-appearing right lower pole cyst. 3.  Very vascular heterogeneous right adrenal mass. 4.  Borderline enlarged right retrocaval lymph node.  INDICATIONS:  The patient is a very pleasant 83 year old man with history of obesity and diabetes who was found several years ago to have a right adrenal mass.  Initial endocrine evaluation was unremarkable for endocrine active neoplasm.  He was  initially placed on surveillance.  On surveillance the mass increased in size over time nearing 6 cm.  He had biopsy of this performed that was equivocal, mostly necrosis.  Given the size progression there was certainly concern of possible renal  carcinoma.  Options were discussed for management including continued surveillance versus medical therapy versus recommended path of right adrenalectomy.  He also has a known fairly large right lower pole renal cyst and in effort to make more room in  retroperitoneal dissection, we discussed a decortication of this and he wished to proceed.  Informed consent was obtained and placed in medical  record.  DESCRIPTION OF PROCEDURE:  The patient being identified, procedure being right adrenalectomy and cystic right renal cyst decortication was confirmed.  Procedure timeout was performed.  Antibiotics administered.  General endotracheal anesthesia induced.   The patient was placed into the right side up full flank position and pulling 15 degrees of table flexion, superior arm elevator axillary roll, sequential compression devices, bottom leg bent, top leg straight.  He was further fastened to the table using  beanbag and then 3-inch tape across the supraxiphoid chest and his pelvis.  A sterile field was created prepped and draped including the patient's entire right flank and abdomen using chlorhexidine gluconate.  Next, a high-flow, low-pressure  pneumoperitoneum was obtained using Veress technique in the right lower quadrant, having passed the aspiration and drop test.  Next an 8 mm robotic camera port was then placed in position approximately 1.5 handbreadths superolateral to the umbilicus.   This was well away from his prior midline incision.  Laparoscopic examination of peritoneal cavity revealed minimal adhesions just some loose omental adhesions in the area of his prior midline scar.  This was quite favorable.  Additional ports were  placed as follows:  Right subcostal 8 mm robotic port, right far lateral, 8 mm robotic port approximately 4 fingerbreadths superior and medial to the anterior iliac spine, right paramedian inferior robotic port approximately 1 handbreadth superior to the  pubic ramus, two 12 mm assistant port sites to midline, 1 in the supraumbilical crease and another 3 fingerbreadths superior to the plane of the camera port, and finally a 5 mm port in the subxiphoid location through which a laparoscopic grasper was  used to elevate the inferior liver edge away from  the anterior surface Gerota's fascia acting as self-locking liver retractor.  Robot was then docked and passed  electronic checks.  Attention was directed at development of the retroperitoneum.  Incision  was made lateral to the ascending colon near the cecum towards the area of the hepatic flexure of the colon.  The colon was carefully swept medially.  The lower pole of the kidney area in the anterior surface of Gerota's fascia was visualized and placed  on gentle lateral traction.  Dissection proceeded medial to this.  The duodenum was encountered and kocherized medially such that it lie medial to the lateral edge of the inferior vena cava.  The ureter and gonadal vessels were identified.  The ureter  was swept laterally.  The gonadal vessels were allowed to fall medially.  Dissection proceeded within this triangle towards the area of the renal hilum.  Renal hilum consisted of single artery, single vein renal vascular anatomy as anticipated.  Incision  was made superiorly and inferiorly to allow for hilar control if needed.  Attention was directed to identification of the lower pole cyst.  Renal parenchyma was identified and the anterior-inferior plain as was the cyst wall.  The area was defatted and  the cyst was purposely incised close to its renal interface and this was suctioned of simple straw-colored fluid and the cyst was then decorticated very closely to the interface at the level of the kidney circumferentially.  This was set aside labeled as  right renal cyst wall.  The serosal edge in apposition to the kidney was demucosalized using coagulation current, which revealed an excellent decortication.  This allowed more room in the retroperitoneum and allowed [place] to place the kidney on inferior  traction, which then opened the space nicely between the superior pole of the kidney and inferior edge of the liver where the adrenal gland lies.  The inferior vena cava was again identified at the level of the renal vessels and developed the plane  medial to the adrenal gland.  The adrenal gland was a very large, very  vascular, and did extend into the retrocaval orientation as anticipated.  As such, the superior plane between the inferior border of the liver and the superior border of the adrenal  was carefully developed creating small columns of tissue, which were controlled using bipolar energy or vascular load stapler.  Similarly, the inferior plane inferior to the adrenal vein but superior to the level of the kidney and renal hilum was  similarly developed using very careful blunt dissection again creating columns of tissue, which were controlled with bipolar energy or vascular stapler and the lateral aspect was then carefully similarly developed as the posterior aspect such that the  only remaining interface was the retrocaval portion of the adrenal mass.  Having better mobility on the adrenal, the adrenal was placed on gentle lateral traction and the retrocaval aspect was carefully dissected free such that it remained only on a  vascular pedicle, which was controlled using vascular load stapler.  This completely freed up the right adrenal.  The specimen was placed in EndoCatch bag for later retrieval.  Inspection of the adrenalectomy bed revealed excellent hemostasis.  There was  a single borderline dominant lymph node estimated to be approximately 1/2 cm in diameter.  Again, given the indication for possible carcinoma decision was made to remove this lymph node and lymphostasis was achieved with cold clips, set aside labeled  right retrocaval lymph node.  Liver retractor was then removed.  There  was no evidence of a hepatic injury.  Sponge, needle counts were correct.  Robot was then undocked.  The previous superior most 12 mm assistant port site was closed with fascia using  Carter-Thomason suture passer and the inferior most site was extended for a distance of approximately 3.5 cm and used an extraction site where the adrenalectomy specimen was removed and set aside from permanent pathology.  Extraction site was  then closed  with fascia using figure-of-eight PDS x3 followed by reapproximation of Scarpa's with a running Vicryl.  All incision sites were infiltrated with dilute lipolyzed Marcaine and closed level skin using subcuticular Monocryl and Dermabond.  Notably, a  closed suction drain was brought out the previous lateral most robotic port site as well given the renal manipulation.  Procedure was then terminated.  The patient tolerated the procedure well.  No immediate complications.  The patient taken to  postanesthesia care unit in stable condition with plan for hospital admission.  CN/NUANCE  D:03/16/2019 T:03/17/2019 JOB:[id number]

## 2019-03-17 NOTE — Discharge Summary (Signed)
[facility] Discharge Summary  Admit date: 03/16/2019  Discharge date and time: 03/17/19   Discharge to: Home  Discharge Service: Urology  Discharge Attending Physician:  Dr. Tresa Moore  Discharge  Diagnoses: Adrenal Mass  Secondary Diagnosis: Active Problems:   Adrenal mass greater than 4 cm in diameter [facility])   OR Procedures: Procedure(s): XI ROBOTIC ADRENALECTOMY, RENAL CYST DECORTICATION AND NODE DISSECTION 03/16/2019   Ancillary Procedures: None   Discharge Day Services: The patient was seen and examined by the Urology team both in the morning and immediately prior to discharge.  Vital signs and laboratory values were stable and within normal limits.  The physical exam was benign and unchanged and all surgical wounds were examined.  Discharge instructions were explained and all questions answered.  Subjective  No acute events overnight. Pain Controlled. No fever or chills.  Objective Patient Vitals for the past 8 hrs:  BP Temp Temp src Pulse Resp SpO2  03/17/19 1321 127/65 98.4 F (36.9 C) Oral 91 18 92 %  03/17/19 1041 -- -- -- 68 -- --  03/17/19 1040 (!) 143/68 -- -- (!) 45 -- 97 %   Total I/O In: -  Out: 720 [Urine:700; Drains:20]  General Appearance:        No acute distress Lungs:                       Normal work of breathing on room air Heart:                                Regular rate and rhythm Abdomen:                         Soft, appropriatley-tender, non-distended Extremities:                      Warm and well perfused GU:        Voiding spontaneously   Hospital Course:  The patient underwent robotic right adrenalectomy and right lower pole renal cyst decortication on 03/16/2019.  The patient tolerated the procedure well, was extubated in the OR, and afterwards was taken to the PACU for routine post-surgical care. When stable the patient was transferred to the floor.   The patient did well postoperatively.  The patients diet was slowly advanced  and at the time of discharge was tolerating a regular diet.  The patient was discharged home 1 Day Post-Op, at which point was tolerating a regular solid diet, was able to void spontaneously, have adequate pain control with P.O. pain medication, and could ambulate without difficulty. The patient will follow up with [place] for post op check.   Condition at Discharge: Improved

## 2019-03-20 LAB — SURGICAL PATHOLOGY

## 2019-05-19 ENCOUNTER — Other Ambulatory Visit: Payer: Self-pay | Admitting: Neurological Surgery

## 2019-05-30 NOTE — Pre-Procedure Instructions (Signed)
[id number] - [place], [place] - [address] [place] [address] Phone: [phone] Fax: [phone]      Your procedure is scheduled on Friday January 29th   Report to [facility] Main Entrance "A" at 1:00 PM., and check in at the Admitting office.  Call this number if you have problems the morning of surgery:  [phone]  Call [phone] if you have any questions prior to your surgery date Monday-Friday 8am-4pm    Remember:  Do not eat or drink after midnight the night before your surgery     Take these medicines the morning of surgery with A SIP OF WATER  amLODipine (NORVASC) carboxymethylcellulose (REFRESH PLUS) if needed   As of today, STOP taking any Aspirin (unless otherwise instructed by your surgeon), Aleve, Naproxen, Ibuprofen, Motrin, Advil, Goody's, BC's, all herbal medications, fish oil, and all vitamins.   HOW TO MANAGE YOUR DIABETES BEFORE AND AFTER SURGERY  Why is it important to control my blood sugar before and after surgery? . Improving blood sugar levels before and after surgery helps healing and can limit problems. . A way of improving blood sugar control is eating a healthy diet by: o  Eating less sugar and carbohydrates o  Increasing activity/exercise o  Talking with your doctor about reaching your blood sugar goals . High blood sugars (greater than 180 mg/dL) can raise your risk of infections and slow your recovery, so you will need to focus on controlling your diabetes during the weeks before surgery. . Make sure that the doctor who takes care of your diabetes knows about your planned surgery including the date and location.  How do I manage my blood sugar before surgery? . Check your blood sugar at least 4 times a day, starting 2 days before surgery, to make sure that the level is not too high or low. o Check your blood sugar the morning of your surgery when  you wake up and every 2 hours until you get to the Short Stay unit. . If your blood sugar is less than 70 mg/dL, you will need to treat for low blood sugar: o Do not take insulin. o Treat a low blood sugar (less than 70 mg/dL) with  cup of clear juice (cranberry or apple), 4 glucose tablets, OR glucose gel. Recheck blood sugar in 15 minutes after treatment (to make sure it is greater than 70 mg/dL). If your blood sugar is not greater than 70 mg/dL on recheck, call [phone] o  for further instructions. . Report your blood sugar to the short stay nurse when you get to Short Stay.  . If you are admitted to the hospital after surgery: o Your blood sugar will be checked by the staff and you will probably be given insulin after surgery (instead of oral diabetes medicines) to make sure you have good blood sugar levels. o The goal for blood sugar control after surgery is 80-180 mg/dL.     WHAT DO I DO ABOUT MY DIABETES MEDICATION?   . THE NIGHT BEFORE SURGERY, take 22 units of HUMULIN 70/30 .       Marland Kitchen THE MORNING OF SURGERY, do NOT take HUMULIN 70/30     The Morning of Surgery  Do not wear jewelry, make-up or nail polish.  Do not wear lotions, powders, or perfumes/colognes, or deodorant  Do not shave 48 hours prior to surgery.  Men may shave  face and neck.  Do not bring valuables to the hospital.  [facility] is not responsible for any belongings or valuables.  If you are a smoker, DO NOT Smoke 24 hours prior to surgery  If you wear a CPAP at night please bring your mask the morning of surgery   Remember that you must have someone to transport you home after your surgery, and remain with you for 24 hours if you are discharged the same day.   Please bring cases for contacts, glasses, hearing aids, dentures or bridgework because it cannot be worn into surgery.    Leave your suitcase in the car.  After surgery it may be brought to your room.  For patients admitted to the hospital,  discharge time will be determined by your treatment team.  Patients discharged the day of surgery will not be allowed to drive home.    Special instructions:   [facility]- Preparing For Surgery  Before surgery, you can play an important role. Because skin is not sterile, your skin needs to be as free of germs as possible. You can reduce the number of germs on your skin by washing with CHG (chlorahexidine gluconate) Soap before surgery.  CHG is an antiseptic cleaner which kills germs and bonds with the skin to continue killing germs even after washing.    Oral Hygiene is also important to reduce your risk of infection.  Remember - BRUSH YOUR TEETH THE MORNING OF SURGERY WITH YOUR REGULAR TOOTHPASTE  Please do not use if you have an allergy to CHG or antibacterial soaps. If your skin becomes reddened/irritated stop using the CHG.  Do not shave (including legs and underarms) for at least 48 hours prior to first CHG shower. It is OK to shave your face.  Please follow these instructions carefully.   1. Shower the NIGHT BEFORE SURGERY and the MORNING OF SURGERY with CHG Soap.   2. If you chose to wash your hair, wash your hair first as usual with your normal shampoo.  3. After you shampoo, rinse your hair and body thoroughly to remove the shampoo.  4. Use CHG as you would any other liquid soap. You can apply CHG directly to the skin and wash gently with a scrungie or a clean washcloth.   5. Apply the CHG Soap to your body ONLY FROM THE NECK DOWN.  Do not use on open wounds or open sores. Avoid contact with your eyes, ears, mouth and genitals (private parts). Wash Face and genitals (private parts)  with your normal soap.   6. Wash thoroughly, paying special attention to the area where your surgery will be performed.  7. Thoroughly rinse your body with warm water from the neck down.  8. DO NOT shower/wash with your normal soap after using and rinsing off the CHG Soap.  9. Pat yourself dry  with a CLEAN TOWEL.  10. Wear CLEAN PAJAMAS to bed the night before surgery, wear comfortable clothes the morning of surgery  11. Place CLEAN SHEETS on your bed the night of your first shower and DO NOT SLEEP WITH PETS.    Day of Surgery:  Please shower the morning of surgery with the CHG soap Do not apply any deodorants/lotions. Please wear clean clothes to the hospital/surgery center.   Remember to brush your teeth WITH YOUR REGULAR TOOTHPASTE.   Please read over the following fact sheets that you were given.

## 2019-05-31 ENCOUNTER — Encounter (HOSPITAL_COMMUNITY): Payer: Self-pay

## 2019-05-31 ENCOUNTER — Other Ambulatory Visit: Payer: Self-pay

## 2019-05-31 ENCOUNTER — Encounter (HOSPITAL_COMMUNITY)
Admission: RE | Admit: 2019-05-31 | Discharge: 2019-05-31 | Disposition: A | Discharge status: Home / Self Care | Payer: Medicare Other | Source: Ambulatory Visit | Attending: Neurological Surgery | Admitting: Neurological Surgery

## 2019-05-31 ENCOUNTER — Ambulatory Visit (HOSPITAL_COMMUNITY)
Admission: RE | Admit: 2019-05-31 | Discharge: 2019-05-31 | Disposition: A | Discharge status: Home / Self Care | Payer: Medicare Other | Source: Ambulatory Visit | Attending: Neurological Surgery | Admitting: Neurological Surgery

## 2019-05-31 ENCOUNTER — Other Ambulatory Visit (HOSPITAL_COMMUNITY)
Admission: RE | Admit: 2019-05-31 | Discharge: 2019-05-31 | Disposition: A | Discharge status: Home / Self Care | Payer: Medicare Other | Source: Ambulatory Visit | Attending: Neurological Surgery | Admitting: Neurological Surgery

## 2019-05-31 DIAGNOSIS — G4730 Sleep apnea, unspecified: Secondary | ICD-10-CM | POA: Insufficient documentation

## 2019-05-31 DIAGNOSIS — E1122 Type 2 diabetes mellitus with diabetic chronic kidney disease: Secondary | ICD-10-CM | POA: Insufficient documentation

## 2019-05-31 DIAGNOSIS — Z87891 Personal history of nicotine dependence: Secondary | ICD-10-CM | POA: Diagnosis not present

## 2019-05-31 DIAGNOSIS — N183 Chronic kidney disease, stage 3 unspecified: Secondary | ICD-10-CM | POA: Insufficient documentation

## 2019-05-31 DIAGNOSIS — Z794 Long term (current) use of insulin: Secondary | ICD-10-CM | POA: Diagnosis not present

## 2019-05-31 DIAGNOSIS — I129 Hypertensive chronic kidney disease with stage 1 through stage 4 chronic kidney disease, or unspecified chronic kidney disease: Secondary | ICD-10-CM | POA: Insufficient documentation

## 2019-05-31 DIAGNOSIS — Z20822 Contact with and (suspected) exposure to covid-19: Secondary | ICD-10-CM | POA: Insufficient documentation

## 2019-05-31 DIAGNOSIS — M4712 Other spondylosis with myelopathy, cervical region: Secondary | ICD-10-CM | POA: Diagnosis not present

## 2019-05-31 DIAGNOSIS — Z79899 Other long term (current) drug therapy: Secondary | ICD-10-CM | POA: Diagnosis not present

## 2019-05-31 DIAGNOSIS — Z01818 Encounter for other preprocedural examination: Secondary | ICD-10-CM | POA: Diagnosis not present

## 2019-05-31 DIAGNOSIS — G959 Disease of spinal cord, unspecified: Secondary | ICD-10-CM

## 2019-05-31 HISTORY — DX: Pneumonia, unspecified organism: J18.9

## 2019-05-31 HISTORY — DX: Personal history of urinary calculi: Z87.442

## 2019-05-31 HISTORY — DX: Unspecified osteoarthritis, unspecified site: M19.90

## 2019-05-31 HISTORY — DX: Gastro-esophageal reflux disease without esophagitis: K21.9

## 2019-05-31 LAB — CBC WITH DIFFERENTIAL/PLATELET
Abs Immature Granulocytes: 0.02 10*3/uL (ref 0.00–0.07)
Basophils Absolute: 0 10*3/uL (ref 0.0–0.1)
Basophils Relative: 1 %
Eosinophils Absolute: 0.3 10*3/uL (ref 0.0–0.5)
Eosinophils Relative: 5 %
HCT: 41 % (ref 39.0–52.0)
Hemoglobin: 12.8 g/dL — ABNORMAL LOW (ref 13.0–17.0)
Immature Granulocytes: 0 %
Lymphocytes Relative: 31 %
Lymphs Abs: 1.7 10*3/uL (ref 0.7–4.0)
MCH: 28.4 pg (ref 26.0–34.0)
MCHC: 31.2 g/dL (ref 30.0–36.0)
MCV: 91.1 fL (ref 80.0–100.0)
Monocytes Absolute: 0.7 10*3/uL (ref 0.1–1.0)
Monocytes Relative: 13 %
Neutro Abs: 2.7 10*3/uL (ref 1.7–7.7)
Neutrophils Relative %: 50 %
Platelets: 216 10*3/uL (ref 150–400)
RBC: 4.5 MIL/uL (ref 4.22–5.81)
RDW: 13.3 % (ref 11.5–15.5)
WBC: 5.4 10*3/uL (ref 4.0–10.5)
nRBC: 0 % (ref 0.0–0.2)

## 2019-05-31 LAB — BASIC METABOLIC PANEL
Anion gap: 11 (ref 5–15)
BUN: 28 mg/dL — ABNORMAL HIGH (ref 8–23)
CO2: 24 mmol/L (ref 22–32)
Calcium: 9.5 mg/dL (ref 8.9–10.3)
Chloride: 105 mmol/L (ref 98–111)
Creatinine, Ser: 2 mg/dL — ABNORMAL HIGH (ref 0.61–1.24)
GFR calc Af Amer: 35 mL/min — ABNORMAL LOW (ref >60)
GFR calc non Af Amer: 30 mL/min — ABNORMAL LOW (ref >60)
Glucose, Bld: 90 mg/dL (ref 70–99)
Potassium: 3.8 mmol/L (ref 3.5–5.1)
Sodium: 140 mmol/L (ref 135–145)

## 2019-05-31 LAB — HEMOGLOBIN A1C
Hgb A1c MFr Bld: 5.9 % — ABNORMAL HIGH (ref 4.8–5.6)
Mean Plasma Glucose: 122.63 mg/dL

## 2019-05-31 LAB — PROTIME-INR
INR: 1 (ref 0.8–1.2)
Prothrombin Time: 13.5 seconds (ref 11.4–15.2)

## 2019-05-31 LAB — ABO/RH: ABO/RH(D): B POS

## 2019-05-31 LAB — GLUCOSE, CAPILLARY: Glucose-Capillary: 91 mg/dL (ref 70–99)

## 2019-05-31 LAB — TYPE AND SCREEN
ABO/RH(D): B POS
Antibody Screen: NEGATIVE

## 2019-05-31 LAB — SURGICAL PCR SCREEN
MRSA, PCR: NEGATIVE
Staphylococcus aureus: NEGATIVE

## 2019-05-31 LAB — SARS CORONAVIRUS 2 (TAT 6-24 HRS): SARS Coronavirus 2: NEGATIVE

## 2019-05-31 NOTE — Progress Notes (Signed)
PCP - Seward Carol MD Cardiologist - pt denies   Chest x-ray - 05/31/19 EKG - 05/31/19 Stress Test - per pt he's had it "they had me walking on a treadmill to see my heart" but does not recall year  ECHO - pt denies Cardiac Cath - pt denies  Sleep Study - yes CPAP - per pt, he does not use or have a CPAP   Fasting Blood Sugar - 91-135 Checks Blood Sugar 2x times a day   COVID TEST- 05/31/19 after PAT appointment    Anesthesia review: yes - abn EKG   Patient denies shortness of breath, fever, cough and chest pain at PAT appointment   All instructions explained to the patient, with a verbal understanding of the material. Patient agrees to go over the instructions while at home for a better understanding. Patient also instructed to self quarantine after being tested for COVID-19. The opportunity to ask questions was provided.

## 2019-06-01 NOTE — Progress Notes (Signed)
Anesthesia Chart Review:  S/p recent right adrenalectomy and renal cyst decortication 99/06/89 without complication.  Hx of OSA, cannot tolerate CPAP.  Hx of CKD III, review of labs shows baseline creatinine ~ 2.3. Creatinine 2.0 on preop labs.  IDDMII well controlled, A1c 5.9 05/31/19.  Remainder of preop labs unremarkable.  EKG 05/31/19: Sinus rhythm with with frequent Premature atrial complexes. Rate 89. Left anterior fascicular block. Nonspecific ST and T wave abnormality. No significant change since last tracing.   Wynonia Musty [facility] Short Stay Center/Anesthesiology Phone [phone] 06/01/2019 1:00 PM

## 2019-06-01 NOTE — Anesthesia Preprocedure Evaluation (Addendum)
Anesthesia Evaluation  Patient identified by MRN, date of birth, ID band Patient awake    Reviewed: Allergy & Precautions, NPO status , Patient's Chart, lab work & pertinent test results  History of Anesthesia Complications Negative for: history of anesthetic complications  Airway Mallampati: II  TM Distance: >3 FB Neck ROM: Full    Dental  (+) Partial Lower, Partial Upper, Dental Advisory Given   Pulmonary sleep apnea , former smoker,    Pulmonary exam normal        Cardiovascular hypertension, Pt. on medications Normal cardiovascular exam     Neuro/Psych    GI/Hepatic Neg liver ROS, GERD  ,  Endo/Other  diabetes  Renal/GU Renal InsufficiencyRenal disease     Musculoskeletal   Abdominal   Peds  Hematology   Anesthesia Other Findings   Reproductive/Obstetrics                                                            Anesthesia Evaluation  Patient identified by MRN, date of birth, ID band Patient awake    Reviewed: Allergy & Precautions, NPO status , Patient's Chart, lab work & pertinent test results  History of Anesthesia Complications Negative for: history of anesthetic complications  Airway Mallampati: I  TM Distance: >3 FB Neck ROM: Full    Dental  (+) Missing, Poor Dentition   Pulmonary sleep apnea , former smoker,    Pulmonary exam normal        Cardiovascular hypertension, Pt. on medications Normal cardiovascular exam     Neuro/Psych negative neurological ROS  negative psych ROS   GI/Hepatic negative GI ROS, Neg liver ROS,   Endo/Other  diabetes, Well Controlled, Insulin Dependent  Renal/GU Renal InsufficiencyRenal disease  negative genitourinary   Musculoskeletal negative musculoskeletal ROS (+)   Abdominal   Peds  Hematology negative hematology ROS (+)   Anesthesia Other Findings Adrenal mass  Reproductive/Obstetrics                             Anesthesia Physical Anesthesia Plan  ASA: III  Anesthesia Plan: General   Post-op Pain Management:    Induction: Intravenous  PONV Risk Score and Plan: 3 and Ondansetron, Dexamethasone and Treatment may vary due to age or medical condition  Airway Management Planned: Oral ETT  Additional Equipment: None  Intra-op Plan:   Post-operative Plan: Extubation in OR  Informed Consent: I have reviewed the patients History and Physical, chart, labs and discussed the procedure including the risks, benefits and alternatives for the proposed anesthesia with the patient or authorized representative who has indicated his/her understanding and acceptance.     Dental advisory given  Plan Discussed with:   Anesthesia Plan Comments: (PIV x2)       Anesthesia Quick Evaluation  Anesthesia Physical Anesthesia Plan  ASA: III  Anesthesia Plan: General   Post-op Pain Management:    Induction: Intravenous  PONV Risk Score and Plan: 3 and Ondansetron, Dexamethasone and Diphenhydramine  Airway Management Planned: Oral ETT  Additional Equipment:   Intra-op Plan:   Post-operative Plan: Extubation in OR  Informed Consent: I have reviewed the patients History and Physical, chart, labs and discussed the procedure including the risks, benefits and alternatives for the proposed anesthesia with the patient  or authorized representative who has indicated his/her understanding and acceptance.     Dental advisory given  Plan Discussed with: Anesthesiologist and CRNA  Anesthesia Plan Comments: (S/p recent right adrenalectomy and renal cyst decortication 25/24/79 without complication.  Hx of OSA, cannot tolerate CPAP.  Hx of CKD III, review of labs shows baseline creatinine ~ 2.3. Creatinine 2.0 on preop labs.  IDDMII well controlled, A1c 5.9 on preop labs.  Remainder of preop labs unremarkable.  EKG 05/31/19: Sinus rhythm with with  frequent Premature atrial complexes. Rate 89. Left anterior fascicular block. Nonspecific ST and T wave abnormality. No significant change since last tracing.)      Anesthesia Quick Evaluation

## 2019-06-03 ENCOUNTER — Other Ambulatory Visit: Payer: Self-pay

## 2019-06-03 ENCOUNTER — Observation Stay (HOSPITAL_COMMUNITY)
Admission: RE | Admit: 2019-06-03 | Discharge: 2019-06-04 | Disposition: A | Discharge status: Home / Self Care | Payer: Medicare Other | Attending: Neurological Surgery | Admitting: Neurological Surgery

## 2019-06-03 ENCOUNTER — Ambulatory Visit (HOSPITAL_COMMUNITY): Payer: Medicare Other

## 2019-06-03 ENCOUNTER — Encounter (HOSPITAL_COMMUNITY): Payer: Self-pay | Admitting: Neurological Surgery

## 2019-06-03 ENCOUNTER — Ambulatory Visit (HOSPITAL_COMMUNITY): Payer: Medicare Other | Admitting: Vascular Surgery

## 2019-06-03 ENCOUNTER — Ambulatory Visit (HOSPITAL_COMMUNITY): Payer: Medicare Other | Admitting: Anesthesiology

## 2019-06-03 ENCOUNTER — Encounter (HOSPITAL_COMMUNITY)
Admission: RE | Disposition: A | Discharge status: Home / Self Care | Payer: Self-pay | Source: Home / Self Care | Attending: Neurological Surgery

## 2019-06-03 DIAGNOSIS — Z981 Arthrodesis status: Secondary | ICD-10-CM

## 2019-06-03 DIAGNOSIS — E1122 Type 2 diabetes mellitus with diabetic chronic kidney disease: Secondary | ICD-10-CM | POA: Insufficient documentation

## 2019-06-03 DIAGNOSIS — Z8249 Family history of ischemic heart disease and other diseases of the circulatory system: Secondary | ICD-10-CM | POA: Diagnosis not present

## 2019-06-03 DIAGNOSIS — Z87442 Personal history of urinary calculi: Secondary | ICD-10-CM | POA: Insufficient documentation

## 2019-06-03 DIAGNOSIS — G4730 Sleep apnea, unspecified: Secondary | ICD-10-CM | POA: Insufficient documentation

## 2019-06-03 DIAGNOSIS — K219 Gastro-esophageal reflux disease without esophagitis: Secondary | ICD-10-CM | POA: Diagnosis not present

## 2019-06-03 DIAGNOSIS — M4802 Spinal stenosis, cervical region: Secondary | ICD-10-CM | POA: Insufficient documentation

## 2019-06-03 DIAGNOSIS — Z794 Long term (current) use of insulin: Secondary | ICD-10-CM | POA: Insufficient documentation

## 2019-06-03 DIAGNOSIS — N183 Chronic kidney disease, stage 3 unspecified: Secondary | ICD-10-CM | POA: Diagnosis not present

## 2019-06-03 DIAGNOSIS — Z79899 Other long term (current) drug therapy: Secondary | ICD-10-CM | POA: Insufficient documentation

## 2019-06-03 DIAGNOSIS — Z87891 Personal history of nicotine dependence: Secondary | ICD-10-CM | POA: Diagnosis not present

## 2019-06-03 DIAGNOSIS — M4712 Other spondylosis with myelopathy, cervical region: Principal | ICD-10-CM | POA: Insufficient documentation

## 2019-06-03 DIAGNOSIS — I129 Hypertensive chronic kidney disease with stage 1 through stage 4 chronic kidney disease, or unspecified chronic kidney disease: Secondary | ICD-10-CM | POA: Insufficient documentation

## 2019-06-03 DIAGNOSIS — Z419 Encounter for procedure for purposes other than remedying health state, unspecified: Secondary | ICD-10-CM

## 2019-06-03 HISTORY — PX: ANTERIOR CERVICAL DECOMP/DISCECTOMY FUSION: SHX1161

## 2019-06-03 LAB — GLUCOSE, CAPILLARY
Glucose-Capillary: 80 mg/dL (ref 70–99)
Glucose-Capillary: 67 mg/dL — ABNORMAL LOW (ref 70–99)
Glucose-Capillary: 73 mg/dL (ref 70–99)
Glucose-Capillary: 192 mg/dL — ABNORMAL HIGH (ref 70–99)
Glucose-Capillary: 233 mg/dL — ABNORMAL HIGH (ref 70–99)

## 2019-06-03 SURGERY — ANTERIOR CERVICAL DECOMPRESSION/DISCECTOMY FUSION 1 LEVEL
Anesthesia: General | Site: Spine Cervical

## 2019-06-03 MED ORDER — METHOCARBAMOL 1000 MG/10ML IJ SOLN
500.0000 mg | Freq: Four times a day (QID) | INTRAVENOUS | Status: DC | PRN
  Filled 2019-06-03: qty 5

## 2019-06-03 MED ORDER — FENTANYL CITRATE (PF) 250 MCG/5ML IJ SOLN
INTRAMUSCULAR | Status: AC
  Filled 2019-06-03: qty 5

## 2019-06-03 MED ORDER — THROMBIN 5000 UNITS EX SOLR
CUTANEOUS | Status: AC
  Filled 2019-06-03: qty 15000

## 2019-06-03 MED ORDER — CHLORHEXIDINE GLUCONATE CLOTH 2 % EX PADS: 6.0000 | MEDICATED_PAD | Freq: Once | CUTANEOUS | Status: DC

## 2019-06-03 MED ORDER — DEXAMETHASONE SODIUM PHOSPHATE 10 MG/ML IJ SOLN
10.0000 mg | Freq: Once | INTRAMUSCULAR | Status: DC
  Filled 2019-06-03: qty 1

## 2019-06-03 MED ORDER — ONDANSETRON HCL 4 MG/2ML IJ SOLN
INTRAMUSCULAR | Status: DC | PRN
  Administered 2019-06-03: 4 mg via INTRAVENOUS

## 2019-06-03 MED ORDER — ONDANSETRON HCL 4 MG/2ML IJ SOLN
INTRAMUSCULAR | Status: AC
  Filled 2019-06-03: qty 2

## 2019-06-03 MED ORDER — DEXAMETHASONE SODIUM PHOSPHATE 10 MG/ML IJ SOLN
INTRAMUSCULAR | Status: AC
  Filled 2019-06-03: qty 1

## 2019-06-03 MED ORDER — EPHEDRINE SULFATE-NACL 50-0.9 MG/10ML-% IV SOSY
PREFILLED_SYRINGE | INTRAVENOUS | Status: DC | PRN
  Administered 2019-06-03: 10 mg via INTRAVENOUS

## 2019-06-03 MED ORDER — ACETAMINOPHEN 325 MG PO TABS: 650.0000 mg | ORAL_TABLET | ORAL | Status: DC | PRN

## 2019-06-03 MED ORDER — HEMOSTATIC AGENTS (NO CHARGE) OPTIME
TOPICAL | Status: DC | PRN
  Administered 2019-06-03: 1 via TOPICAL

## 2019-06-03 MED ORDER — METHOCARBAMOL 500 MG PO TABS
500.0000 mg | ORAL_TABLET | Freq: Four times a day (QID) | ORAL | Status: DC | PRN
  Administered 2019-06-03: 17:00:00 500 mg via ORAL
  Filled 2019-06-03: qty 1

## 2019-06-03 MED ORDER — BUPIVACAINE HCL (PF) 0.25 % IJ SOLN
INTRAMUSCULAR | Status: AC
  Filled 2019-06-03: qty 30

## 2019-06-03 MED ORDER — EPHEDRINE 5 MG/ML INJ
INTRAVENOUS | Status: AC
  Filled 2019-06-03: qty 10

## 2019-06-03 MED ORDER — SODIUM CHLORIDE 0.9 % IV SOLN
INTRAVENOUS | Status: DC | PRN
  Administered 2019-06-03: 13:00:00 500 mL

## 2019-06-03 MED ORDER — LIDOCAINE 2% (20 MG/ML) 5 ML SYRINGE
INTRAMUSCULAR | Status: AC
  Filled 2019-06-03: qty 5

## 2019-06-03 MED ORDER — CEFAZOLIN SODIUM-DEXTROSE 2-4 GM/100ML-% IV SOLN
2.0000 g | Freq: Three times a day (TID) | INTRAVENOUS | Status: AC
  Administered 2019-06-03 – 2019-06-04 (×2): 2 g via INTRAVENOUS
  Filled 2019-06-03 (×2): qty 100

## 2019-06-03 MED ORDER — POTASSIUM CHLORIDE IN NACL 20-0.9 MEQ/L-% IV SOLN: INTRAVENOUS | Status: DC

## 2019-06-03 MED ORDER — INSULIN ASPART 100 UNIT/ML ~~LOC~~ SOLN
0.0000 [IU] | Freq: Three times a day (TID) | SUBCUTANEOUS | Status: DC
  Administered 2019-06-03: 3 [IU] via SUBCUTANEOUS

## 2019-06-03 MED ORDER — BUPIVACAINE HCL (PF) 0.25 % IJ SOLN
INTRAMUSCULAR | Status: DC | PRN
  Administered 2019-06-03: 4 mL

## 2019-06-03 MED ORDER — SODIUM CHLORIDE 0.9 % IV SOLN: 250.0000 mL | INTRAVENOUS | Status: DC

## 2019-06-03 MED ORDER — MENTHOL 3 MG MT LOZG: 1.0000 | LOZENGE | OROMUCOSAL | Status: DC | PRN

## 2019-06-03 MED ORDER — THROMBIN 5000 UNITS EX SOLR
CUTANEOUS | Status: DC | PRN
  Administered 2019-06-03 (×2): 5000 [IU] via TOPICAL

## 2019-06-03 MED ORDER — INSULIN ASPART 100 UNIT/ML ~~LOC~~ SOLN
0.0000 [IU] | Freq: Three times a day (TID) | SUBCUTANEOUS | Status: DC
  Administered 2019-06-04 (×2): 5 [IU] via SUBCUTANEOUS

## 2019-06-03 MED ORDER — PHENOL 1.4 % MT LIQD
1.0000 | OROMUCOSAL | Status: DC | PRN
  Filled 2019-06-03: qty 177

## 2019-06-03 MED ORDER — SENNA 8.6 MG PO TABS
1.0000 | ORAL_TABLET | Freq: Two times a day (BID) | ORAL | Status: DC
  Administered 2019-06-03: 21:00:00 8.6 mg via ORAL
  Filled 2019-06-03: qty 1

## 2019-06-03 MED ORDER — TAMSULOSIN HCL 0.4 MG PO CAPS
0.4000 mg | ORAL_CAPSULE | Freq: Every day | ORAL | Status: DC
  Administered 2019-06-03: 0.4 mg via ORAL
  Filled 2019-06-03: qty 1

## 2019-06-03 MED ORDER — GLYCOPYRROLATE PF 0.2 MG/ML IJ SOSY
PREFILLED_SYRINGE | INTRAMUSCULAR | Status: AC
  Filled 2019-06-03: qty 1

## 2019-06-03 MED ORDER — ROCURONIUM BROMIDE 10 MG/ML (PF) SYRINGE
PREFILLED_SYRINGE | INTRAVENOUS | Status: AC
  Filled 2019-06-03: qty 10

## 2019-06-03 MED ORDER — PROPOFOL 10 MG/ML IV BOLUS
INTRAVENOUS | Status: DC | PRN
  Administered 2019-06-03: 150 mg via INTRAVENOUS

## 2019-06-03 MED ORDER — LIDOCAINE 20MG/ML (2%) 15 ML SYRINGE OPTIME
INTRAMUSCULAR | Status: DC | PRN
  Administered 2019-06-03: 80 mg via INTRAVENOUS

## 2019-06-03 MED ORDER — ACETAMINOPHEN 500 MG PO TABS
1000.0000 mg | ORAL_TABLET | Freq: Once | ORAL | Status: AC
  Administered 2019-06-03: 10:00:00 1000 mg via ORAL
  Filled 2019-06-03: qty 2

## 2019-06-03 MED ORDER — ACETAMINOPHEN 650 MG RE SUPP: 650.0000 mg | RECTAL | Status: DC | PRN

## 2019-06-03 MED ORDER — MORPHINE SULFATE (PF) 2 MG/ML IV SOLN: 2.0000 mg | INTRAVENOUS | Status: DC | PRN

## 2019-06-03 MED ORDER — SODIUM CHLORIDE 0.9% FLUSH
3.0000 mL | Freq: Two times a day (BID) | INTRAVENOUS | Status: DC
  Administered 2019-06-03: 17:00:00 3 mL via INTRAVENOUS

## 2019-06-03 MED ORDER — IRBESARTAN 300 MG PO TABS
300.0000 mg | ORAL_TABLET | Freq: Every day | ORAL | Status: DC
  Administered 2019-06-04: 08:00:00 300 mg via ORAL
  Filled 2019-06-03: qty 1

## 2019-06-03 MED ORDER — PHENYLEPHRINE HCL-NACL 10-0.9 MG/250ML-% IV SOLN
INTRAVENOUS | Status: DC | PRN
  Administered 2019-06-03: 15 ug/min via INTRAVENOUS

## 2019-06-03 MED ORDER — PHENYLEPHRINE 40 MCG/ML (10ML) SYRINGE FOR IV PUSH (FOR BLOOD PRESSURE SUPPORT)
PREFILLED_SYRINGE | INTRAVENOUS | Status: AC
  Filled 2019-06-03: qty 10

## 2019-06-03 MED ORDER — CEFAZOLIN SODIUM-DEXTROSE 2-4 GM/100ML-% IV SOLN
2.0000 g | INTRAVENOUS | Status: AC
  Administered 2019-06-03: 2 g via INTRAVENOUS
  Filled 2019-06-03: qty 100

## 2019-06-03 MED ORDER — SODIUM CHLORIDE 0.9 % IV SOLN: INTRAVENOUS | Status: DC | PRN

## 2019-06-03 MED ORDER — DEXAMETHASONE SODIUM PHOSPHATE 10 MG/ML IJ SOLN
INTRAMUSCULAR | Status: DC | PRN
  Administered 2019-06-03: 5 mg via INTRAVENOUS

## 2019-06-03 MED ORDER — ONDANSETRON HCL 4 MG/2ML IJ SOLN: 4.0000 mg | Freq: Four times a day (QID) | INTRAMUSCULAR | Status: DC | PRN

## 2019-06-03 MED ORDER — ONDANSETRON HCL 4 MG PO TABS: 4.0000 mg | ORAL_TABLET | Freq: Four times a day (QID) | ORAL | Status: DC | PRN

## 2019-06-03 MED ORDER — INSULIN ASPART 100 UNIT/ML ~~LOC~~ SOLN
0.0000 [IU] | Freq: Every day | SUBCUTANEOUS | Status: DC
  Administered 2019-06-03: 22:00:00 2 [IU] via SUBCUTANEOUS

## 2019-06-03 MED ORDER — HYDROCODONE-ACETAMINOPHEN 5-325 MG PO TABS
1.0000 | ORAL_TABLET | ORAL | Status: DC | PRN
  Administered 2019-06-03 – 2019-06-04 (×4): 1 via ORAL
  Filled 2019-06-03 (×4): qty 1

## 2019-06-03 MED ORDER — PROPOFOL 10 MG/ML IV BOLUS
INTRAVENOUS | Status: AC
  Filled 2019-06-03: qty 20

## 2019-06-03 MED ORDER — THROMBIN 5000 UNITS EX SOLR
OROMUCOSAL | Status: DC | PRN
  Administered 2019-06-03: 13:00:00 5 mL via TOPICAL

## 2019-06-03 MED ORDER — DEXAMETHASONE SODIUM PHOSPHATE 4 MG/ML IJ SOLN: 2.0000 mg | Freq: Four times a day (QID) | INTRAMUSCULAR | Status: DC

## 2019-06-03 MED ORDER — GLYCOPYRROLATE PF 0.2 MG/ML IJ SOSY
PREFILLED_SYRINGE | INTRAMUSCULAR | Status: DC | PRN
  Administered 2019-06-03: .1 mg via INTRAVENOUS

## 2019-06-03 MED ORDER — CELECOXIB 200 MG PO CAPS
400.0000 mg | ORAL_CAPSULE | Freq: Once | ORAL | Status: AC
  Administered 2019-06-03: 10:00:00 400 mg via ORAL
  Filled 2019-06-03: qty 2

## 2019-06-03 MED ORDER — FENTANYL CITRATE (PF) 100 MCG/2ML IJ SOLN
INTRAMUSCULAR | Status: DC | PRN
  Administered 2019-06-03: 150 ug via INTRAVENOUS
  Administered 2019-06-03: 50 ug via INTRAVENOUS

## 2019-06-03 MED ORDER — SODIUM CHLORIDE 0.9% FLUSH: 3.0000 mL | INTRAVENOUS | Status: DC | PRN

## 2019-06-03 MED ORDER — ROCURONIUM BROMIDE 10 MG/ML (PF) SYRINGE
PREFILLED_SYRINGE | INTRAVENOUS | Status: DC | PRN
  Administered 2019-06-03: 50 mg via INTRAVENOUS

## 2019-06-03 MED ORDER — AMLODIPINE BESYLATE 5 MG PO TABS
5.0000 mg | ORAL_TABLET | Freq: Every day | ORAL | Status: DC
  Administered 2019-06-04: 5 mg via ORAL
  Filled 2019-06-03: qty 1

## 2019-06-03 MED ORDER — 0.9 % SODIUM CHLORIDE (POUR BTL) OPTIME
TOPICAL | Status: DC | PRN
  Administered 2019-06-03: 1000 mL

## 2019-06-03 MED ORDER — SUCCINYLCHOLINE CHLORIDE 200 MG/10ML IV SOSY
PREFILLED_SYRINGE | INTRAVENOUS | Status: DC | PRN
  Administered 2019-06-03: 140 mg via INTRAVENOUS

## 2019-06-03 MED ORDER — SUGAMMADEX SODIUM 200 MG/2ML IV SOLN
INTRAVENOUS | Status: DC | PRN
  Administered 2019-06-03: 200 mg via INTRAVENOUS

## 2019-06-03 MED ORDER — DEXAMETHASONE 4 MG PO TABS
4.0000 mg | ORAL_TABLET | Freq: Four times a day (QID) | ORAL | Status: DC
  Administered 2019-06-03 – 2019-06-04 (×4): 4 mg via ORAL
  Filled 2019-06-03 (×4): qty 1

## 2019-06-03 SURGICAL SUPPLY — 58 items
BAG DECANTER FOR FLEXI CONT (MISCELLANEOUS) ×3 IMPLANT
BASKET BONE COLLECTION (BASKET) ×3 IMPLANT
BENZOIN TINCTURE PRP APPL 2/3 (GAUZE/BANDAGES/DRESSINGS) ×3 IMPLANT
BIT DRILL 14MM (INSTRUMENTS) ×1 IMPLANT
BUR CARBIDE MATCH 3.0 (BURR) ×3 IMPLANT
CANISTER SUCT 3000ML PPV (MISCELLANEOUS) ×3 IMPLANT
CARTRIDGE OIL MAESTRO DRILL (MISCELLANEOUS) ×1 IMPLANT
CLOSURE WOUND 1/2 X4 (GAUZE/BANDAGES/DRESSINGS) ×1
COVER WAND RF STERILE (DRAPES) IMPLANT
DIFFUSER DRILL AIR PNEUMATIC (MISCELLANEOUS) ×3 IMPLANT
DRAPE C-ARM 42X72 X-RAY (DRAPES) ×6 IMPLANT
DRAPE LAPAROTOMY 100X72 PEDS (DRAPES) ×3 IMPLANT
DRAPE MICROSCOPE LEICA (MISCELLANEOUS) ×3 IMPLANT
DRILL 14MM (INSTRUMENTS) ×3
DRSG OPSITE POSTOP 3X4 (GAUZE/BANDAGES/DRESSINGS) ×3 IMPLANT
DURAPREP 6ML APPLICATOR 50/CS (WOUND CARE) ×3 IMPLANT
ELECT COATED BLADE 2.86 ST (ELECTRODE) ×3 IMPLANT
ELECT REM PT RETURN 9FT ADLT (ELECTROSURGICAL) ×3
ELECTRODE REM PT RTRN 9FT ADLT (ELECTROSURGICAL) ×1 IMPLANT
GAUZE 4X4 16PLY RFD (DISPOSABLE) IMPLANT
GLOVE BIO SURGEON STRL SZ 6.5 (GLOVE) ×2 IMPLANT
GLOVE BIO SURGEON STRL SZ7 (GLOVE) ×9 IMPLANT
GLOVE BIO SURGEON STRL SZ8 (GLOVE) ×3 IMPLANT
GLOVE BIO SURGEONS STRL SZ 6.5 (GLOVE) ×1
GLOVE BIOGEL PI IND STRL 6.5 (GLOVE) ×1 IMPLANT
GLOVE BIOGEL PI IND STRL 7.0 (GLOVE) ×1 IMPLANT
GLOVE BIOGEL PI IND STRL 7.5 (GLOVE) ×1 IMPLANT
GLOVE BIOGEL PI INDICATOR 6.5 (GLOVE) ×2
GLOVE BIOGEL PI INDICATOR 7.0 (GLOVE) ×2
GLOVE BIOGEL PI INDICATOR 7.5 (GLOVE) ×2
GOWN STRL REUS W/ TWL LRG LVL3 (GOWN DISPOSABLE) ×1 IMPLANT
GOWN STRL REUS W/ TWL XL LVL3 (GOWN DISPOSABLE) ×2 IMPLANT
GOWN STRL REUS W/TWL 2XL LVL3 (GOWN DISPOSABLE) IMPLANT
GOWN STRL REUS W/TWL LRG LVL3 (GOWN DISPOSABLE) ×2
GOWN STRL REUS W/TWL XL LVL3 (GOWN DISPOSABLE) ×4
HEMOSTAT POWDER KIT SURGIFOAM (HEMOSTASIS) ×3 IMPLANT
KIT BASIN OR (CUSTOM PROCEDURE TRAY) ×3 IMPLANT
KIT TURNOVER KIT B (KITS) ×3 IMPLANT
NEEDLE HYPO 25X1 1.5 SAFETY (NEEDLE) ×3 IMPLANT
NEEDLE SPNL 20GX3.5 QUINCKE YW (NEEDLE) ×3 IMPLANT
NS IRRIG 1000ML POUR BTL (IV SOLUTION) ×3 IMPLANT
OIL CARTRIDGE MAESTRO DRILL (MISCELLANEOUS) ×3
PACK LAMINECTOMY NEURO (CUSTOM PROCEDURE TRAY) ×3 IMPLANT
PAD ARMBOARD 7.5X6 YLW CONV (MISCELLANEOUS) ×9 IMPLANT
PIN DISTRACTION 14MM (PIN) ×9 IMPLANT
PLATE 14MM (Plate) ×3 IMPLANT
RUBBERBAND STERILE (MISCELLANEOUS) ×6 IMPLANT
SCREW CANN 4X16 SS S/DRILL (Screw) ×6 IMPLANT
SCREW SPINAL 4X14MM TITANIUM (Screw) ×6 IMPLANT
SPACER ASSEM CERV LORD 8M (Spacer) ×3 IMPLANT
SPONGE INTESTINAL PEANUT (DISPOSABLE) ×3 IMPLANT
SPONGE SURGIFOAM ABS GEL SZ50 (HEMOSTASIS) ×3 IMPLANT
STRIP CLOSURE SKIN 1/2X4 (GAUZE/BANDAGES/DRESSINGS) ×2 IMPLANT
SUT VIC AB 3-0 SH 8-18 (SUTURE) ×3 IMPLANT
SUT VICRYL 4-0 PS2 18IN ABS (SUTURE) ×3 IMPLANT
TOWEL GREEN STERILE (TOWEL DISPOSABLE) ×3 IMPLANT
TOWEL GREEN STERILE FF (TOWEL DISPOSABLE) ×3 IMPLANT
WATER STERILE IRR 1000ML POUR (IV SOLUTION) ×3 IMPLANT

## 2019-06-03 NOTE — Transfer of Care (Signed)
Immediate Anesthesia Transfer of Care Note  Patient: Brandon Weaver  Procedure(s) Performed: ANTERIOR CERVICAL DECOMPRESSION FUSION CERVICAL THREE-FOUR (N/A Spine Cervical)  Patient Location: PACU  Anesthesia Type:General  Level of Consciousness: awake and patient cooperative  Airway & Oxygen Therapy: Patient Spontanous Breathing and Patient connected to face mask oxygen  Post-op Assessment: Report given to RN and Post -op Vital signs reviewed and stable, moving all extremities.  Post vital signs: Reviewed and stable  Last Vitals:  Vitals Value Taken Time  BP    Temp    Pulse    Resp    SpO2      Last Pain:  Vitals:   06/03/19 1021  TempSrc:   PainSc: 0-No pain         Complications: No apparent anesthesia complications

## 2019-06-03 NOTE — Anesthesia Postprocedure Evaluation (Signed)
Anesthesia Post Note  Patient: Brandon Weaver  Procedure(s) Performed: ANTERIOR CERVICAL DECOMPRESSION FUSION CERVICAL THREE-FOUR (N/A Spine Cervical)     Patient location during evaluation: PACU Anesthesia Type: General Level of consciousness: sedated Pain management: pain level controlled Vital Signs Assessment: post-procedure vital signs reviewed and stable Respiratory status: spontaneous breathing and respiratory function stable Cardiovascular status: stable Postop Assessment: no apparent nausea or vomiting Anesthetic complications: no    Last Vitals:  Vitals:   06/03/19 1500 06/03/19 1525  BP:  (!) 146/67  Pulse:  71  Resp:  19  Temp: 36.7 C 36.6 C  SpO2:  98%    Last Pain:  Vitals:   06/03/19 1525  TempSrc: Oral  PainSc:                  Brandon Weaver DANIEL

## 2019-06-03 NOTE — Op Note (Signed)
06/03/2019  1:42 PM  PATIENT:  Coralee North  84 y.o. 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 Decompressive anterior cervical discectomy C3-4, 2. Anterior cervical arthrodesis C3-4 utilizing a 8 mm cortical cancellus bone graft, 3. Anterior cervical plating C3-4 utilizing a Alphatec plate  SURGEON:  Sherley Bounds, MD  ASSISTANTS: Glenford Peers, FNP  ANESTHESIA:   General  EBL: 50 ml  Total I/O In: 800 [I.V.:800] Out: 50 [Blood:50]  BLOOD ADMINISTERED: none  DRAINS: none  SPECIMEN:  none  INDICATION FOR PROCEDURE: This patient presented with neck pain with numbness in his hands. Imaging showed progressive stenosis C3-4 with signal change in the spinal cord. The patient tried conservative measures without relief. Pain was debilitating. Recommended ACDF with plating. Patient understood the risks, benefits, and alternatives and potential outcomes and wished to proceed.  PROCEDURE DETAILS: Patient was brought to the operating room placed under general endotracheal anesthesia. Patient was placed in the supine position on the operating room table. The neck was prepped with Duraprep and draped in a sterile fashion.   Three cc of local anesthesia was injected and a transverse incision was made on the right side of the neck.  Dissection was carried down thru the subcutaneous tissue and the platysma was  elevated, opened, and undermined with Metzenbaum scissors.  Dissection was then carried out thru an avascular plane leaving the sternocleidomastoid carotid artery and jugular vein laterally and the trachea and esophagus medially with the assistance of my nurse practitioner. The ventral aspect of the vertebral column was identified and a localizing x-ray was taken. The C3-4 level was identified and all in the room agreed with the level. The longus colli muscles were then elevated and  the retractor was placed with the assistance of my nurse practitioner. The annulus was incised and the disc space entered. Discectomy was performed with micro-curettes and pituitary rongeurs. I then used the electric drill to drill the endplates down to the level of the posterior longitudinal ligament.  The operating microscope was draped and brought into the field provided additional magnification, illumination and visualization. Discectomy was continued posteriorly thru the disc space. Posterior longitudinal ligament was opened with a nerve hook, and then removed along with disc herniation and osteophytes, decompressing the spinal canal and thecal sac. We then continued to remove osteophytic overgrowth and disc material decompressing the neural foramina and exiting nerve roots bilaterally. The scope was angled up and down to help decompress and undercut the vertebral bodies. Once the decompression was completed we could pass a nerve hook circumferentially to assure adequate decompression in the midline and in the neural foramina. So by both visualization and palpation we felt we had an adequate decompression of the neural elements. We then measured the height of the intravertebral disc space and selected a 8 millimeter cortical cancellus allograft. It was then gently positioned in the intravertebral disc space and countersunk. I then used a 14 mm Alphatec plate and placed 14 mm variable angle screws into the vertebral bodies of each level and locked them into position. The wound was irrigated with bacitracin solution, checked for hemostasis which was established and confirmed. Once meticulous hemostasis was achieved, we then proceeded with closure with the assistance of my nurse practitioner. The platysma was closed with interrupted 3-0 undyed Vicryl suture, the subcuticular layer was closed with interrupted 3-0 undyed Vicryl suture. The skin edges were approximated with steristrips. The drapes  were removed. A  sterile dressing was applied. The patient was then awakened from general anesthesia and transferred to the recovery room in stable condition. At the end of the procedure all sponge, needle and instrument counts were correct.   PLAN OF CARE: Admit for overnight observation  PATIENT DISPOSITION:  PACU - hemodynamically stable.   Delay start of Pharmacological VTE agent (>24hrs) due to surgical blood loss or risk of bleeding:  yes

## 2019-06-03 NOTE — H&P (Signed)
Subjective:   Patient is a 84 y.o. male admitted for cervical stenosis. The patient first presented to me with complaints of neck pain and numbness of the arm(s). Onset of symptoms was several months ago. The pain is described as aching and occurs all day. The pain is rated moderate, and is located in the neck and radiates to the arms. The symptoms have been progressive. Symptoms are exacerbated by extending head backwards, and are relieved by none.  Previous work up includes MRI of cervical spine, results: spinal stenosis.  Past Medical History:  Diagnosis Date  . Arthritis    per pt 05/31/19  . CKD (chronic kidney disease), stage III   . Complication of anesthesia    Oxygen gets low when under anesthesia  . Diabetes ([place])    Has to be on insulin pancrease exploded due to pancreatitis   . GERD (gastroesophageal reflux disease)    hx of GERD - had reflux surgery (see surgical hx) per pt 05/31/19  . Headache    hx of migraine  . History of kidney stones    per pt 05/31/19 "they told me to leave it alone until it gives me problems, so I still got them"   . Hypertension   . Pneumonia    hx of pneumonia ~10years ago 05/31/19  . Sleep apnea    does not tolerate cpap    Past Surgical History:  Procedure Laterality Date  . DENTAL SURGERY     dental plates put in upper and lower ~7-8 years ago per pt 05/31/19  . EYE SURGERY Left    lasik eye surgery "about 15-16 years ago" 05/31/19  . L shoulder surgery     muscle repair  . reflux surgery    . ROBOTIC ADRENALECTOMY Right 03/16/2019   Procedure: XI ROBOTIC ADRENALECTOMY, RENAL CYST DECORTICATION AND NODE DISSECTION;  Surgeon: Alexis Frock, MD;  Location: [facility] ORS;  Service: Urology;  Laterality: Right;  3 HRS  . TONSILLECTOMY      Allergies  Allergen Reactions  . Laxative [Bisacodyl] Hives    All laxatives caused hives But was able to take magnesium citrate for surgery    Social History   Tobacco Use  . Smoking status: Former Smoker     Packs/day: 0.50    Years: 30.00    Pack years: 15.00    Types: Cigarettes    Quit date: 05/05/1986    Years since quitting: 33.1  . Smokeless tobacco: Never Used  Substance Use Topics  . Alcohol use: Not Currently    Family History  Problem Relation Age of Onset  . COPD Brother   . COPD Mother   . Heart disease Mother   . Sleep apnea Son   . Cancer - Prostate Brother    Prior to Admission medications   Medication Sig Start Date End Date Taking? Authorizing Provider  amLODipine (NORVASC) 5 MG tablet Take 5 mg by mouth daily. 12/17/18  Yes [provider]  carboxymethylcellulose (REFRESH PLUS) 0.5 % SOLN Place 1-2 drops into both eyes 3 (three) times daily as needed (dry/irritated eyes.).   Yes [provider]  HUMULIN 70/30 (70-30) 100 UNIT/ML injection Inject 45 Units into the skin 2 (two) times daily. 01/24/19  Yes [provider]  irbesartan (AVAPRO) 300 MG tablet Take 300 mg by mouth daily.   Yes [provider]  rosuvastatin (CRESTOR) 5 MG tablet Take 5 mg by mouth daily.   Yes [provider]  tamsulosin (FLOMAX) 0.4  MG CAPS capsule Take 0.4 mg by mouth at bedtime. 03/14/19  Yes [provider]  Cholecalciferol (VITAMIN D3) 1.25 MG (50000 UT) CAPS Take 1 capsule by mouth once a week. 05/16/19   [provider]     Review of Systems  Positive ROS: neg  All other systems have been reviewed and were otherwise negative with the exception of those mentioned in the HPI and as above.  Objective: Vital signs in last 24 hours: Temp:  [98.7 F (37.1 C)] 98.7 F (37.1 C) (01/29 0957) Pulse Rate:  [89] 89 (01/29 0957) Resp:  [18] 18 (01/29 0957) BP: (166)/(87) 166/87 (01/29 0957) SpO2:  [100 %] 100 % (01/29 0957) Weight:  [108.4 kg] 108.4 kg (01/29 0957)  General Appearance: Alert, cooperative, no distress, appears stated age Head: Normocephalic, without obvious abnormality, atraumatic Eyes: PERRL,  conjunctiva/corneas clear, EOM's intact      Neck: Supple, symmetrical, trachea midline, Back: Symmetric, no curvature, ROM normal, no CVA tenderness Lungs:  respirations unlabored Heart: Regular rate and rhythm Abdomen: Soft, non-tender Extremities: Extremities normal, atraumatic, no cyanosis or edema Pulses: 2+ and symmetric all extremities Skin: Skin color, texture, turgor normal, no rashes or lesions  NEUROLOGIC:  Mental status: Alert and oriented x4, no aphasia, good attention span, fund of knowledge and memory  Motor Exam - grossly normal Sensory Exam - grossly normal Reflexes: 3+ Coordination - grossly normal Gait - grossly normal Balance - grossly normal Cranial Nerves: I: smell Not tested  II: visual acuity  OS: nl    OD: nl  II: visual fields Full to confrontation  II: pupils Equal, round, reactive to light  III,VII: ptosis None  III,IV,VI: extraocular muscles  Full ROM  V: mastication Normal  V: facial light touch sensation  Normal  V,VII: corneal reflex  Present  VII: facial muscle function - upper  Normal  VII: facial muscle function - lower Normal  VIII: hearing Not tested  IX: soft palate elevation  Normal  IX,X: gag reflex Present  XI: trapezius strength  5/5  XI: sternocleidomastoid strength 5/5  XI: neck flexion strength  5/5  XII: tongue strength  Normal    Data Review Lab Results  Component Value Date   WBC 5.4 05/31/2019   HGB 12.8 (L) 05/31/2019   HCT 41.0 05/31/2019   MCV 91.1 05/31/2019   PLT 216 05/31/2019   Lab Results  Component Value Date   NA 140 05/31/2019   K 3.8 05/31/2019   CL 105 05/31/2019   CO2 24 05/31/2019   BUN 28 (H) 05/31/2019   CREATININE 2.00 (H) 05/31/2019   GLUCOSE 90 05/31/2019   Lab Results  Component Value Date   INR 1.0 05/31/2019    Assessment:   Cervical neck pain with herniated nucleus pulposus/ spondylosis/ stenosis at C3-4. Estimated body mass index is 34.29 kg/m as calculated from the  following:   Height as of this encounter: 5\' 10"  (1.778 m).   Weight as of this encounter: 108.4 kg.  Patient has failed conservative therapy. Planned surgery : ACDF C3-4  Plan:   I explained the condition and procedure to the patient and answered any questions.  Patient wishes to proceed with procedure as planned. Understands risks/ benefits/ and expected or typical outcomes.  Brandon Weaver 06/03/2019 11:52 AM

## 2019-06-03 NOTE — Anesthesia Procedure Notes (Signed)
Procedure Name: Intubation Date/Time: 06/03/2019 12:10 PM Performed by: Lowella Dell, CRNA Pre-anesthesia Checklist: Patient identified, Emergency Drugs available, Suction available and Patient being monitored Patient Re-evaluated:Patient Re-evaluated prior to induction Oxygen Delivery Method: Circle System Utilized Preoxygenation: Pre-oxygenation with 100% oxygen Induction Type: IV induction and Rapid sequence Laryngoscope Size: Mac and 4 Grade View: Grade II Tube type: Oral Tube size: 7.5 mm Number of attempts: 1 Airway Equipment and Method: Stylet Placement Confirmation: ETT inserted through vocal cords under direct vision,  positive ETCO2 and breath sounds checked- equal and bilateral Secured at: 21 cm Tube secured with: Tape Dental Injury: Teeth and Oropharynx as per pre-operative assessment

## 2019-06-04 DIAGNOSIS — M4712 Other spondylosis with myelopathy, cervical region: Secondary | ICD-10-CM | POA: Diagnosis not present

## 2019-06-04 LAB — GLUCOSE, CAPILLARY
Glucose-Capillary: 241 mg/dL — ABNORMAL HIGH (ref 70–99)
Glucose-Capillary: 248 mg/dL — ABNORMAL HIGH (ref 70–99)

## 2019-06-04 MED ORDER — HYDROCODONE-ACETAMINOPHEN 5-325 MG PO TABS: 1.0000 | ORAL_TABLET | ORAL | 0 refills | Status: DC | PRN

## 2019-06-04 MED ORDER — METHOCARBAMOL 500 MG PO TABS: 500.0000 mg | ORAL_TABLET | Freq: Four times a day (QID) | ORAL | 2 refills | Status: DC | PRN

## 2019-06-04 NOTE — Progress Notes (Signed)
  NEUROSURGERY PROGRESS NOTE   No issues overnight.  Complains of appropriate neck soreness No new N/T/W Ambulating well. Voiding normal. Tolerating regular diet Eager for d/c home  EXAM:  BP (!) 152/71 (BP Location: Right Arm)   Pulse 61   Temp 98.6 F (37 C) (Oral)   Resp 16   Ht 5\' 10"  (1.778 m)   Wt 108.4 kg   SpO2 97%   BMI 34.29 kg/m   Awake, alert, oriented  Speech fluent, appropriate  CN grossly intact  MAEW, nonfocal Incision: c/d/i  IMPRESSION/PLAN 84 y.o. male pod #1 C3-4 ACDF. Doing well - d/c home

## 2019-06-04 NOTE — Consult Note (Deleted)
Physician Discharge Summary  Patient ID: Brandon Weaver MRN: [id number] DOB/AGE: 1936/02/14 84 y.o.  Admit date: 06/03/2019 Discharge date: 06/04/2019  Admission Diagnoses:  Cervical stenosis with myelopathy  Discharge Diagnoses:  Same Active Problems:   S/P cervical spinal fusion  Discharged Condition: Stable  Hospital Course:  HRISHI Weaver is a 84 y.o. male who was admitted for the below procedure. There were no post operative complications. At time of discharge, pain was well controlled, ambulating with Pt/OT, tolerating po, voiding normal. Ready for discharge.  Treatments: Surgery - C3-4 ACDF  Discharge Exam: Blood pressure (!) 152/71, pulse 61, temperature 98.6 F (37 C), temperature source Oral, resp. rate 16, height 5\' 10"  (1.778 m), weight 108.4 kg, SpO2 97 %. Awake, alert, oriented Speech fluent, appropriate CN grossly intact 5/5 BUE/BLE Wound c/d/i  Disposition: Discharge disposition: 01-Home or Self Care       Discharge Instructions    Call MD for:  difficulty breathing, headache or visual disturbances   Complete by: As directed    Call MD for:  persistant dizziness or light-headedness   Complete by: As directed    Call MD for:  redness, tenderness, or signs of infection (pain, swelling, redness, odor or green/yellow discharge around incision site)   Complete by: As directed    Call MD for:  severe uncontrolled pain   Complete by: As directed    Call MD for:  temperature >100.4   Complete by: As directed    Diet general   Complete by: As directed    Driving Restrictions   Complete by: As directed    Do not drive until given clearance.   Increase activity slowly   Complete by: As directed    Lifting restrictions   Complete by: As directed    Do not lift anything >10lbs. Avoid bending and twisting in awkward positions. Avoid bending at the back.   May shower / Bathe   Complete by: As directed    In 24 hours. Okay to wash wound with warm soapy  water. Avoid scrubbing the wound. Pat dry.   Remove dressing in 24 hours   Complete by: As directed      Allergies as of 06/04/2019      Reactions   Laxative [bisacodyl] Hives   All laxatives caused hives But was able to take magnesium citrate for surgery      Medication List    TAKE these medications   amLODipine 5 MG tablet Commonly known as: NORVASC Take 5 mg by mouth daily.   carboxymethylcellulose 0.5 % Soln Commonly known as: REFRESH PLUS Place 1-2 drops into both eyes 3 (three) times daily as needed (dry/irritated eyes.).   HumuLIN 70/30 (70-30) 100 UNIT/ML injection Generic drug: insulin NPH-regular Human Inject 45 Units into the skin 2 (two) times daily.   HYDROcodone-acetaminophen 5-325 MG tablet Commonly known as: NORCO/VICODIN Take 1 tablet by mouth every 4 (four) hours as needed for moderate pain ((score 4 to 6)).   irbesartan 300 MG tablet Commonly known as: AVAPRO Take 300 mg by mouth daily.   methocarbamol 500 MG tablet Commonly known as: ROBAXIN Take 1 tablet (500 mg total) by mouth every 6 (six) hours as needed for muscle spasms.   rosuvastatin 5 MG tablet Commonly known as: CRESTOR Take 5 mg by mouth daily.   tamsulosin 0.4 MG Caps capsule Commonly known as: FLOMAX Take 0.4 mg by mouth at bedtime.   Vitamin D3 1.25 MG (50000 UT) Caps Take 1 capsule  by mouth once a week.      Follow-up Information    Lisbeth Renshaw, MD Follow up.   Specialty: Neurosurgery Contact information: [address] [place] [address] [phone]           Signed: Alyson Ingles 06/04/2019, 8:12 AM

## 2019-06-04 NOTE — Discharge Summary (Signed)
Physician Discharge Summary  Patient ID: Brandon Weaver MRN: [id number] DOB/AGE: June 18, 1935 84 y.o.  Admit date: 06/03/2019 Discharge date: 06/04/2019  Admission Diagnoses:  Cervical stenosis with myelopathy  Discharge Diagnoses:  Same Active Problems:   S/P cervical spinal fusion   Discharged Condition: Stable  Hospital Course:  Brandon Weaver is a 84 y.o. male who was admitted for the below procedure. There were no post operative complications. At time of discharge, pain was well controlled, ambulating with Pt/OT, tolerating po, voiding normal. Ready for discharge.  Treatments: Surgery - C3-4 ACDF  Discharge Exam: Blood pressure (!) 152/71, pulse 61, temperature 98.6 F (37 C), temperature source Oral, resp. rate 16, height 5\' 10"  (1.778 m), weight 108.4 kg, SpO2 97 %. Awake, alert, oriented Speech fluent, appropriate CN grossly intact 5/5 BUE/BLE Wound c/d/i  Disposition: Discharge disposition: 01-Home or Self Care       Discharge Instructions    Call MD for:  difficulty breathing, headache or visual disturbances   Complete by: As directed    Call MD for:  persistant dizziness or light-headedness   Complete by: As directed    Call MD for:  redness, tenderness, or signs of infection (pain, swelling, redness, odor or green/yellow discharge around incision site)   Complete by: As directed    Call MD for:  severe uncontrolled pain   Complete by: As directed    Call MD for:  temperature >100.4   Complete by: As directed    Diet general   Complete by: As directed    Driving Restrictions   Complete by: As directed    Do not drive until given clearance.   Increase activity slowly   Complete by: As directed    Lifting restrictions   Complete by: As directed    Do not lift anything >10lbs. Avoid bending and twisting in awkward positions. Avoid bending at the back.   May shower / Bathe   Complete by: As directed    In 24 hours. Okay to wash wound with warm  soapy water. Avoid scrubbing the wound. Pat dry.   Remove dressing in 24 hours   Complete by: As directed      Allergies as of 06/04/2019      Reactions   Laxative [bisacodyl] Hives   All laxatives caused hives But was able to take magnesium citrate for surgery      Medication List    TAKE these medications   amLODipine 5 MG tablet Commonly known as: NORVASC Take 5 mg by mouth daily.   carboxymethylcellulose 0.5 % Soln Commonly known as: REFRESH PLUS Place 1-2 drops into both eyes 3 (three) times daily as needed (dry/irritated eyes.).   HumuLIN 70/30 (70-30) 100 UNIT/ML injection Generic drug: insulin NPH-regular Human Inject 45 Units into the skin 2 (two) times daily.   HYDROcodone-acetaminophen 5-325 MG tablet Commonly known as: NORCO/VICODIN Take 1 tablet by mouth every 4 (four) hours as needed for moderate pain ((score 4 to 6)).   irbesartan 300 MG tablet Commonly known as: AVAPRO Take 300 mg by mouth daily.   methocarbamol 500 MG tablet Commonly known as: ROBAXIN Take 1 tablet (500 mg total) by mouth every 6 (six) hours as needed for muscle spasms.   rosuvastatin 5 MG tablet Commonly known as: CRESTOR Take 5 mg by mouth daily.   tamsulosin 0.4 MG Caps capsule Commonly known as: FLOMAX Take 0.4 mg by mouth at bedtime.   Vitamin D3 1.25 MG (50000 UT) Caps Take 1  capsule by mouth once a week.      Follow-up Information    Tia Alert, MD .   Specialty: Neurosurgery Contact information: [address] [place] [address] [phone]           Signed: Alyson Ingles 06/04/2019, 8:15 AM

## 2019-06-04 NOTE — Evaluation (Signed)
Occupational Therapy Evaluation Patient Details Name: Brandon Weaver MRN: [id number] DOB: Oct 01, 1935 Today's Date: 06/04/2019    History of Present Illness Pt is an 84 yo male s/p ACDF C3-4. PMHx: arthritis, DM, HTN, L shoulder sx.   Clinical Impression   Pt PTA: Pt living with spouse and reports retired and independent. Pt currently with 3/10 pain in neck, but otherwise, feels good. Pt given cervical hand out and verbal discussion for education on precautions; pt able to teach back 3/3 precautions. Pt able to perform figure 4 technique for LB ADL and able to stand to donn pants. Pt with no noted limitations in UB/LB to perform ADL efficiently.  Back handout provided and reviewed adls in detail. Pt educated on to: set an alarm at night for medication, avoid sitting for long periods of time, correct bed positioning for sleeping, correct sequence for bed mobility, avoiding lifting more than 5 pounds and never wash directly over incision. All education is complete and patient indicates understanding. Pt does not require continued OT skilled services. OT signing off.      Follow Up Recommendations  No OT follow up;Supervision - Intermittent    Equipment Recommendations  None recommended by OT    Recommendations for Other Services       Precautions / Restrictions Precautions Precautions: Cervical Precaution Booklet Issued: Yes (comment) Precaution Comments: verbal discussion; pt able to teach back 3/3 precautions Restrictions Weight Bearing Restrictions: No      Mobility Bed Mobility Overal bed mobility: Needs Assistance Bed Mobility: Sidelying to Sit   Sidelying to sit: Supervision       General bed mobility comments: supervisionA for log roll technique  Transfers Overall transfer level: Modified independent                    Balance Overall balance assessment: No apparent balance deficits (not formally assessed)                                         ADL either performed or assessed with clinical judgement   ADL Overall ADL's : At baseline;Modified independent                                       General ADL Comments: Pt able to perform figure 4 technique for LB ADL and able to stand to donn pants. Pt with no noted limitations in UB/LB to perform ADL efficiently.     Vision Baseline Vision/History: Wears glasses Wears Glasses: At all times Patient Visual Report: No change from baseline Vision Assessment?: No apparent visual deficits     Perception     Praxis      Pertinent Vitals/Pain Pain Assessment: 0-10 Pain Score: 3  Pain Location: neck Pain Descriptors / Indicators: Discomfort Pain Intervention(s): Limited activity within patient's tolerance     Hand Dominance Right   Extremity/Trunk Assessment Upper Extremity Assessment Upper Extremity Assessment: Overall WFL for tasks assessed   Lower Extremity Assessment Lower Extremity Assessment: Overall WFL for tasks assessed   Cervical / Trunk Assessment Cervical / Trunk Assessment: Other exceptions Cervical / Trunk Exceptions: s/p cervical sx   Communication Communication Communication: No difficulties   Cognition Arousal/Alertness: Awake/alert Behavior During Therapy: WFL for tasks assessed/performed Overall Cognitive Status: Within Functional Limits for tasks assessed  General Comments  Pt managing stairs for 3-4 up/down. pt prefers to back up going down while holding onto rail. Pt transferring in/out of step in shower to simulate tub shower at home.    Exercises     Shoulder Instructions      Home Living Family/patient expects to be discharged to:: Private residence Living Arrangements: Spouse/significant other;Children Available Help at Discharge: Family;Available 24 hours/day Type of Home: House Home Access: Stairs to enter [employer] of Steps: 3 Entrance  Stairs-Rails: Can reach both Home Layout: One level     Bathroom Shower/Tub: Teacher, early years/pre: Handicapped height     Home Equipment: Environmental consultant - 2 wheels;Cane - single point;Shower seat;Grab bars - tub/shower          Prior Functioning/Environment Level of Independence: Independent                 OT Problem List: Pain      OT Treatment/Interventions:      OT Goals(Current goals can be found in the care plan section) Acute Rehab OT Goals Patient Stated Goal: to go home soon OT Goal Formulation: With patient  OT Frequency:     Barriers to D/C:            Co-evaluation              AM-PAC OT "6 Clicks" Daily Activity     Outcome Measure Help from another person eating meals?: None Help from another person taking care of personal grooming?: None Help from another person toileting, which includes using toliet, bedpan, or urinal?: None Help from another person bathing (including washing, rinsing, drying)?: A Little Help from another person to put on and taking off regular upper body clothing?: None Help from another person to put on and taking off regular lower body clothing?: None 6 Click Score: 23   End of Session Nurse Communication: Mobility status  Activity Tolerance: Patient tolerated treatment well Patient left: in chair;with call bell/phone within reach  OT Visit Diagnosis: Unsteadiness on feet (R26.81);Pain Pain - part of body: (neck)                Time: 1103-1594 OT Time Calculation (min): 27 min Charges:  OT General Charges $OT Visit: 1 Visit OT Evaluation $OT Eval Moderate Complexity: 1 Mod OT Treatments $Self Care/Home Management : 8-22 mins  Jefferey Pica OTR/L Acute Rehabilitation Services Pager: [phone] Office: [phone]   Tuere Nwosu  C 06/04/2019, 9:35 AM

## 2019-06-04 NOTE — Progress Notes (Signed)
Patient alert and oriented, mae's well, voiding adequate amount of urine, swallowing without difficulty, no c/o pain at time of discharge. Patient discharged home with family. Script and discharged instructions given to patient. Patient and family stated understanding of instructions given. Patient has an appointment with Dr. Jones in 2 weeks ?

## 2019-06-06 ENCOUNTER — Encounter: Payer: Self-pay | Admitting: *Deleted

## 2019-09-06 ENCOUNTER — Emergency Department (HOSPITAL_COMMUNITY): Payer: Medicare Other

## 2019-09-06 ENCOUNTER — Emergency Department (HOSPITAL_COMMUNITY)
Admission: EM | Admit: 2019-09-06 | Discharge: 2019-09-06 | Disposition: A | Discharge status: Home / Self Care | Payer: Medicare Other | Attending: Emergency Medicine | Admitting: Emergency Medicine

## 2019-09-06 ENCOUNTER — Encounter (HOSPITAL_COMMUNITY): Payer: Self-pay | Admitting: Emergency Medicine

## 2019-09-06 ENCOUNTER — Other Ambulatory Visit: Payer: Self-pay

## 2019-09-06 DIAGNOSIS — Z87891 Personal history of nicotine dependence: Secondary | ICD-10-CM | POA: Insufficient documentation

## 2019-09-06 DIAGNOSIS — R042 Hemoptysis: Secondary | ICD-10-CM | POA: Diagnosis not present

## 2019-09-06 DIAGNOSIS — Z79899 Other long term (current) drug therapy: Secondary | ICD-10-CM | POA: Diagnosis not present

## 2019-09-06 DIAGNOSIS — N183 Chronic kidney disease, stage 3 unspecified: Secondary | ICD-10-CM | POA: Diagnosis not present

## 2019-09-06 DIAGNOSIS — R1011 Right upper quadrant pain: Secondary | ICD-10-CM | POA: Diagnosis not present

## 2019-09-06 DIAGNOSIS — I129 Hypertensive chronic kidney disease with stage 1 through stage 4 chronic kidney disease, or unspecified chronic kidney disease: Secondary | ICD-10-CM | POA: Diagnosis not present

## 2019-09-06 DIAGNOSIS — E1122 Type 2 diabetes mellitus with diabetic chronic kidney disease: Secondary | ICD-10-CM | POA: Diagnosis not present

## 2019-09-06 DIAGNOSIS — Z20822 Contact with and (suspected) exposure to covid-19: Secondary | ICD-10-CM | POA: Insufficient documentation

## 2019-09-06 DIAGNOSIS — Z794 Long term (current) use of insulin: Secondary | ICD-10-CM | POA: Diagnosis not present

## 2019-09-06 LAB — COMPREHENSIVE METABOLIC PANEL
ALT: 15 U/L (ref 0–44)
AST: 13 U/L — ABNORMAL LOW (ref 15–41)
Albumin: 3.5 g/dL (ref 3.5–5.0)
Alkaline Phosphatase: 49 U/L (ref 38–126)
Anion gap: 8 (ref 5–15)
BUN: 29 mg/dL — ABNORMAL HIGH (ref 8–23)
CO2: 24 mmol/L (ref 22–32)
Calcium: 9.6 mg/dL (ref 8.9–10.3)
Chloride: 104 mmol/L (ref 98–111)
Creatinine, Ser: 2.48 mg/dL — ABNORMAL HIGH (ref 0.61–1.24)
GFR calc Af Amer: 27 mL/min — ABNORMAL LOW (ref >60)
GFR calc non Af Amer: 23 mL/min — ABNORMAL LOW (ref >60)
Glucose, Bld: 121 mg/dL — ABNORMAL HIGH (ref 70–99)
Potassium: 4.8 mmol/L (ref 3.5–5.1)
Sodium: 136 mmol/L (ref 135–145)
Total Bilirubin: 0.8 mg/dL (ref 0.3–1.2)
Total Protein: 7.3 g/dL (ref 6.5–8.1)

## 2019-09-06 LAB — RESPIRATORY PANEL BY RT PCR (FLU A&B, COVID)
Influenza A by PCR: NEGATIVE
Influenza B by PCR: NEGATIVE
SARS Coronavirus 2 by RT PCR: NEGATIVE

## 2019-09-06 LAB — URINALYSIS, ROUTINE W REFLEX MICROSCOPIC
Bilirubin Urine: NEGATIVE
Glucose, UA: NEGATIVE mg/dL
Hgb urine dipstick: NEGATIVE
Ketones, ur: NEGATIVE mg/dL
Leukocytes,Ua: NEGATIVE
Nitrite: NEGATIVE
Protein, ur: 100 mg/dL — AB
Specific Gravity, Urine: 1.016 (ref 1.005–1.030)
pH: 5 (ref 5.0–8.0)

## 2019-09-06 LAB — CBC
HCT: 43.9 % (ref 39.0–52.0)
Hemoglobin: 13.8 g/dL (ref 13.0–17.0)
MCH: 28.5 pg (ref 26.0–34.0)
MCHC: 31.4 g/dL (ref 30.0–36.0)
MCV: 90.7 fL (ref 80.0–100.0)
Platelets: 236 10*3/uL (ref 150–400)
RBC: 4.84 MIL/uL (ref 4.22–5.81)
RDW: 13.8 % (ref 11.5–15.5)
WBC: 8.8 10*3/uL (ref 4.0–10.5)
nRBC: 0 % (ref 0.0–0.2)

## 2019-09-06 LAB — TYPE AND SCREEN
ABO/RH(D): B POS
Antibody Screen: NEGATIVE

## 2019-09-06 LAB — CBG MONITORING, ED: Glucose-Capillary: 76 mg/dL (ref 70–99)

## 2019-09-06 MED ORDER — TECHNETIUM TC 99M DIETHYLENETRIAME-PENTAACETIC ACID
42.5000 | Freq: Once | INTRAVENOUS | Status: AC | PRN
  Administered 2019-09-06: 16:00:00 42.5 via RESPIRATORY_TRACT

## 2019-09-06 MED ORDER — DICLOFENAC SODIUM 1 % EX GEL: 2.0000 g | Freq: Four times a day (QID) | CUTANEOUS | 0 refills | Status: DC

## 2019-09-06 MED ORDER — TECHNETIUM TO 99M ALBUMIN AGGREGATED
1.6000 | Freq: Once | INTRAVENOUS | Status: AC | PRN
  Administered 2019-09-06: 1.6 via INTRAVENOUS

## 2019-09-06 MED ORDER — HYDROCODONE-ACETAMINOPHEN 5-325 MG PO TABS: 1.0000 | ORAL_TABLET | Freq: Four times a day (QID) | ORAL | 0 refills | Status: DC | PRN

## 2019-09-06 MED ORDER — SODIUM CHLORIDE 0.9 % IV BOLUS
1000.0000 mL | Freq: Once | INTRAVENOUS | Status: AC
  Administered 2019-09-06: 11:00:00 1000 mL via INTRAVENOUS

## 2019-09-06 NOTE — Discharge Instructions (Signed)
Use voltaren as directed.   Prescription given for Norco. Take medication as directed and do not operate machinery, drive a car, or work while taking this medication as it can make you drowsy.   Please follow up with your primary care provider within 5-7 days for re-evaluation of your symptoms. If you do not have a primary care provider, information for a healthcare clinic has been provided for you to make arrangements for follow up care. Please return to the emergency department for any new or worsening symptoms.

## 2019-09-06 NOTE — ED Triage Notes (Signed)
Pt reports R sided abd pain and 2 episodes of hematemesis with brb x2. Denies hx of the same.

## 2019-09-06 NOTE — ED Provider Notes (Signed)
[facility] EMERGENCY DEPARTMENT Provider Note   CSN: [id number] Arrival date & time: 09/06/19  0856     History Chief Complaint  Patient presents with   Hemoptysis    Brandon Weaver is a 84 y.o. male.  HPI    Pt is an 84 y/o male with a h/o CKD, DM, GERD, nephrolithiasis, HTN, who presents the emergency department today for evaluation of hemoptysis.  Patient reports he has had 2 episodes of hemoptysis where he has noted bright red blood.  States that it has been a small amount and estimates it was likely dime sized.  He notes that last week he did a lot of heavy lifting on Wednesday/Thursday.  He initially developed some lower back pain however the pain has now migrated up to the right flank area, right upper quadrant and right chest.  This pain is intermittent and seems to be worse when he takes a deep breath.  He notes that he has had a chronic cough and it is not worse than normal.  He denies any shortness of breath here.  He denies any nausea, vomiting, diarrhea, bloody stools, melena, or hematemesis denies the pain is worse with eating.  Denies heavy NSAID use. Denies heavy ETOH use.   Denies h/o tobacco use.   Denies leg pain/swelling, hemoptysis, recent surgery/trauma, recent long travel, hormone use, personal hx of cancer, or hx of DVT/PE.  He did have a cervical spine fusion about 5 months ago in January and prior to that he also had surgery on his kidney and adrenal gland.   Past Medical History:  Diagnosis Date   Arthritis    per pt 05/31/19   CKD (chronic kidney disease), stage III    Complication of anesthesia    Oxygen gets low when under anesthesia   Diabetes ([place])    Has to be on insulin pancrease exploded due to pancreatitis    GERD (gastroesophageal reflux disease)    hx of GERD - had reflux surgery (see surgical hx) per pt 05/31/19   Headache    hx of migraine   History of kidney stones    per pt 05/31/19 "they told me to leave it  alone until it gives me problems, so I still got them"    Hypertension    Pneumonia    hx of pneumonia ~10years ago 05/31/19   Sleep apnea    does not tolerate cpap    Patient Active Problem List   Diagnosis Date Noted   S/P cervical spinal fusion 06/03/2019   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/16/2019   OSA (obstructive sleep apnea) 03/01/2014    Past Surgical History:  Procedure Laterality Date   ANTERIOR CERVICAL DECOMP/DISCECTOMY FUSION N/A 06/03/2019   Procedure: ANTERIOR CERVICAL DECOMPRESSION FUSION CERVICAL THREE-FOUR;  Surgeon: Eustace Moore, MD;  Location: [place];  Service: Neurosurgery;  Laterality: N/A;  anterior   DENTAL SURGERY     dental plates put in upper and lower ~7-8 years ago per pt 05/31/19   EYE SURGERY Left    lasik eye surgery "about 15-16 years ago" 05/31/19   L shoulder surgery     muscle repair   reflux surgery     ROBOTIC ADRENALECTOMY Right 03/16/2019   Procedure: XI ROBOTIC ADRENALECTOMY, RENAL CYST DECORTICATION AND NODE DISSECTION;  Surgeon: Alexis Frock, MD;  Location: [facility];  Service: Urology;  Laterality: Right;  3 HRS   TONSILLECTOMY  Family History  Problem Relation Age of Onset   COPD Brother    COPD Mother    Heart disease Mother    Sleep apnea Son    Cancer - Prostate Brother     Social History   Tobacco Use   Smoking status: Former Smoker    Packs/day: 0.50    Years: 30.00    Pack years: 15.00    Types: Cigarettes    Quit date: 05/05/1986    Years since quitting: 33.3   Smokeless tobacco: Never Used  Substance Use Topics   Alcohol use: Not Currently   Drug use: Never    Home Medications Prior to Admission medications   Medication Sig Start Date End Date Taking? Authorizing Provider  amLODipine (NORVASC) 5 MG tablet Take 5 mg by mouth daily. 12/17/18  Yes [provider]  carboxymethylcellulose (REFRESH PLUS) 0.5 % SOLN Place 1-2 drops into both eyes 3 (three)  times daily as needed (dry/irritated eyes.).   Yes [provider]  cyclobenzaprine (FLEXERIL) 10 MG tablet Take 10 mg by mouth 3 (three) times daily as needed for muscle spasms.  07/05/19  Yes [provider]  HUMULIN 70/30 (70-30) 100 UNIT/ML injection Inject 45 Units into the skin 2 (two) times daily. 01/24/19  Yes [provider]  irbesartan (AVAPRO) 300 MG tablet Take 300 mg by mouth daily.   Yes [provider]  [facility] ULTRA test strip 1 each by Other route in the morning and at bedtime. 07/15/19  Yes [provider]  tamsulosin (FLOMAX) 0.4 MG CAPS capsule Take 0.4 mg by mouth as needed.  03/14/19  Yes [provider]  Cholecalciferol (VITAMIN D3) 1.25 MG (50000 UT) CAPS Take 1 capsule by mouth once a week. 05/16/19   [provider]  diclofenac Sodium (VOLTAREN) 1 % GEL Apply 2 g topically 4 (four) times daily. 09/06/19   Romualdo Prosise S, PA-C  HYDROcodone-acetaminophen (NORCO/VICODIN) 5-325 MG tablet Take 1 tablet by mouth every 6 (six) hours as needed. 09/06/19   Tejasvi Brissett S, PA-C  methocarbamol (ROBAXIN) 500 MG tablet Take 1 tablet (500 mg total) by mouth every 6 (six) hours as needed for muscle spasms. Patient not taking: Reported on 09/06/2019 06/04/19   Traci Sermon, PA-C    Allergies    Laxative [bisacodyl]  Review of Systems   Review of Systems  Constitutional: Negative for fever.  HENT: Negative for ear pain and sore throat.   Eyes: Negative for visual disturbance.  Respiratory: Positive for cough. Negative for shortness of breath.   Cardiovascular: Positive for chest pain (right chest pain with inspiration).  Gastrointestinal: Negative for abdominal pain, constipation, diarrhea, nausea and vomiting.  Genitourinary: Negative for dysuria and hematuria.  Musculoskeletal: Negative for back pain.  Skin: Negative for rash.  Neurological: Negative for headaches.  All other systems reviewed and are  negative.   Physical Exam Updated Vital Signs BP (!) 156/82    Pulse 93    Temp 98.1 F (36.7 C) (Oral)    Resp 18    SpO2 94%   Physical Exam Vitals and nursing note reviewed.  Constitutional:      Appearance: He is well-developed.  HENT:     Head: Normocephalic and atraumatic.  Eyes:     Conjunctiva/sclera: Conjunctivae normal.  Cardiovascular:     Rate and Rhythm: Normal rate and regular rhythm.     Heart sounds: Normal heart sounds. No murmur.  Pulmonary:     Effort: Pulmonary effort  is normal. No respiratory distress.     Breath sounds: Rales (RLL) present. No wheezing or rhonchi.  Chest:     Chest wall: No tenderness.  Abdominal:     General: Bowel sounds are normal.     Palpations: Abdomen is soft.     Tenderness: There is abdominal tenderness (RUQ). There is no guarding or rebound.  Musculoskeletal:        General: No tenderness.     Cervical back: Neck supple.     Right lower leg: No edema.     Left lower leg: No edema.  Skin:    General: Skin is warm and dry.  Neurological:     Mental Status: He is alert.     ED Results / Procedures / Treatments   Labs (all labs ordered are listed, but only abnormal results are displayed) Labs Reviewed  COMPREHENSIVE METABOLIC PANEL - Abnormal; Notable for the following components:      Result Value   Glucose, Bld 121 (*)    BUN 29 (*)    Creatinine, Ser 2.48 (*)    AST 13 (*)    GFR calc non Af Amer 23 (*)    GFR calc Af Amer 27 (*)    All other components within normal limits  URINALYSIS, ROUTINE W REFLEX MICROSCOPIC - Abnormal; Notable for the following components:   Protein, ur 100 (*)    Bacteria, UA RARE (*)    All other components within normal limits  RESPIRATORY PANEL BY RT PCR (FLU A&B, COVID)  CBC  CBG MONITORING, ED  TYPE AND SCREEN    EKG EKG Interpretation  Date/Time:  Tuesday Sep 06 2019 10:22:01 EDT Ventricular Rate:  90 PR Interval:    QRS Duration: 108 QT Interval:  362 QTC  Calculation: 443 R Axis:   -70 Text Interpretation: Sinus rhythm Left anterior fascicular block Abnormal R-wave progression, late transition No acute changes No significant change since last tracing Confirmed by Varney Biles [phone]) on 09/06/2019 1:19:36 PM   Radiology NM PULMONARY VENT AND PERF (V/Q Scan)  Result Date: 09/06/2019 CLINICAL DATA:  PE suspected, high prob Chest pain. EXAM: NUCLEAR MEDICINE VENTILATION - PERFUSION LUNG SCAN TECHNIQUE: Ventilation images were obtained in multiple projections using inhaled aerosol Tc-81m DTPA. Perfusion images were obtained in multiple projections after intravenous injection of Tc-42m MAA. RADIOPHARMACEUTICALS:  42.5 mCi of Tc-30m DTPA aerosol inhalation and 1.6 mCi Tc63m MAA IV COMPARISON:  Chest radiograph earlier today. Lung bases from abdominal CT earlier this day. FINDINGS: Ventilation: Mildly heterogeneous with small perfusion defect at the right lung base corresponding to pleural thickening on CT. Perfusion: Mildly heterogeneous that matches the ventilation study. Small ventilation defect of the right lung base that matches ventilation and corresponding to pleural thickening on CT. IMPRESSION: Heterogeneous ventilation and perfusion with a small matched defect at the right lung base corresponding to pleural thickening on CT. No ventilation perfusion mismatches. This constitutes a very low probability ventilation perfusion scan based on PIOPED 2 criteria. Electronically Signed   By: Keith Rake M.D.   On: 09/06/2019 16:06   DG Chest Portable 1 View  Result Date: 09/06/2019 CLINICAL DATA:  Chest pain. EXAM: PORTABLE CHEST 1 VIEW COMPARISON:  May 31, 2019. FINDINGS: The heart size and mediastinal contours are within normal limits. Both lungs are clear. No pneumothorax or pleural effusion is noted. The visualized skeletal structures are unremarkable. IMPRESSION: No active disease. Electronically Signed   By: Marijo Conception M.D.   On:  09/06/2019  10:39   CT Renal Stone Study  Result Date: 09/06/2019 CLINICAL DATA:  Pt reports R sided abd pain and 2 episodes of hematemesis with brb x2. Denies hx of the same States he thinks he lifted something too heavyFlank pain, kidney stone suspected EXAM: CT ABDOMEN AND PELVIS WITHOUT CONTRAST TECHNIQUE: Multidetector CT imaging of the abdomen and pelvis was performed following the standard protocol without IV contrast. COMPARISON:  CT 11/01/2018 FINDINGS: Lower chest: Bands of linear atelectasis at the RIGHT lung base increased from comparison exam Hepatobiliary: No focal hepatic lesion on noncontrast exam. Gallbladder stone within collapsed gallbladder. Pancreas: Pancreas is normal. No ductal dilatation. No pancreatic inflammation. Spleen: Normal spleen Adrenals/urinary tract: LEFT adrenal gland normal. Post RIGHT adrenalectomy. Large nonobstructing calculus in the RIGHT kidney measures 12 mm. No ureterolithiasis or obstructive uropathy. No LEFT renal calculi or ureteral calculi. Low-attenuation cystic lesion in the LEFT kidney measures 3.1 cm most consistent benign renal cyst. Similar lesion the posterior cortex of the LEFT kidney. No bladder calculi Stomach/Bowel: Stomach, small bowel, appendix, and cecum are normal. The colon and rectosigmoid colon are normal. Vascular/Lymphatic: Abdominal aorta is normal caliber with atherosclerotic calcification. There is no retroperitoneal or periportal lymphadenopathy. No pelvic lymphadenopathy. Reproductive: Prostate is enlarged measuring 66 mm in diameter which is unchanged from comparison exam Other: No free fluid. Musculoskeletal: No aggressive osseous lesion. Diffuse sclerosis of the vertebral bodies suggests medical renal disease IMPRESSION: 1. No ureterolithiasis or obstructive uropathy. 2. Nonobstructing calculus in the RIGHT kidney. 3. Enlarged prostate gland. 4. Normal appendix. 5. No explanation for RIGHT-sided pain. Electronically Signed   By: Suzy Bouchard M.D.    On: 09/06/2019 12:32    Procedures Procedures (including critical care time)  Medications Ordered in ED Medications  sodium chloride 0.9 % bolus 1,000 mL (0 mLs Intravenous Stopped 09/06/19 1153)  technetium albumin aggregated (MAA) injection solution 1.6 millicurie (1.6 millicuries Intravenous Contrast Given 09/06/19 1503)  technetium TC 35M diethylenetriame-pentaacetic acid (DTPA) injection 01.0 millicurie (27.2 millicuries Inhalation Given 09/06/19 1547)    ED Course  I have reviewed the triage vital signs and the nursing notes.  Pertinent labs & imaging results that were available during my care of the patient were reviewed by me and considered in my medical decision making (see chart for details).    MDM Rules/Calculators/A&P                      84 year old male presenting for evaluation of pleuritic right lower chest pain, right upper quadrant abdominal pain.  Also with hemoptysis  On arrival vital signs are reassuring.  CBC is without leukocytosis or anemia CMP with normal electrolytes, kidney function slightly elevated at 2.48, BUN slightly elevated 29.  Liver enzymes are normal. Covid testing is negative UA not consistent with UTI  EKG Sinus rhythm Left anterior fascicular block Abnormal R-wave progression, late transition No acute changes No significant change since last tracing   CT renal with no ureterolithiasis or obstructive uropathy. Nonobstructing calculus in the RIGHT kidney. Enlarged prostate gland. Normal appendix. No explanation for RIGHT-sided pain.   VQ scan with heterogeneous ventilation and perfusion with a small matched defect at the right lung base corresponding to pleural thickening on CT. No ventilation perfusion mismatches. This constitutes a very low probability ventilation perfusion scan based on PIOPED 2 criteria.  Pt with reassuring w/u in the ED today. He does not appear to have any emergent cause of his sxs today and this may be  related to MSK  cause. I will give rx for voltaren gel and pain meds. Advised pcp f/u and return precautions. He voices understanding of the plan and reasons to return. All questions answered, pt stable for d/c.   Case discussed with Dr. Kathrynn Humble who personally evaluated the patient and is in agreement with the plan   Final Clinical Impression(s) / ED Diagnoses Final diagnoses:  RUQ abdominal pain  Hemoptysis    Rx / DC Orders ED Discharge Orders         Ordered    diclofenac Sodium (VOLTAREN) 1 % GEL  4 times daily     09/06/19 1641    HYDROcodone-acetaminophen (NORCO/VICODIN) 5-325 MG tablet  Every 6 hours PRN     09/06/19 1641           Marvis Bakken S, PA-C 09/06/19 1846    Varney Biles, MD 09/08/19 1929

## 2019-09-06 NOTE — ED Notes (Signed)
Taken to Nuclear Medicine

## 2020-05-17 ENCOUNTER — Ambulatory Visit
Admission: RE | Admit: 2020-05-17 | Discharge: 2020-05-17 | Disposition: A | Discharge status: Home / Self Care | Payer: Medicare Other | Source: Ambulatory Visit | Attending: Internal Medicine | Admitting: Internal Medicine

## 2020-05-17 ENCOUNTER — Other Ambulatory Visit: Payer: Self-pay | Admitting: Internal Medicine

## 2020-05-17 DIAGNOSIS — R059 Cough, unspecified: Secondary | ICD-10-CM

## 2020-05-17 DIAGNOSIS — R053 Chronic cough: Secondary | ICD-10-CM | POA: Diagnosis not present

## 2020-05-17 DIAGNOSIS — I10 Essential (primary) hypertension: Secondary | ICD-10-CM | POA: Diagnosis not present

## 2020-06-01 ENCOUNTER — Other Ambulatory Visit (HOSPITAL_COMMUNITY): Payer: Self-pay | Admitting: *Deleted

## 2020-06-01 DIAGNOSIS — R1310 Dysphagia, unspecified: Secondary | ICD-10-CM

## 2020-06-05 ENCOUNTER — Ambulatory Visit (HOSPITAL_COMMUNITY)
Admission: RE | Admit: 2020-06-05 | Discharge: 2020-06-05 | Disposition: A | Discharge status: Home / Self Care | Payer: Medicare Other | Source: Ambulatory Visit | Attending: Internal Medicine | Admitting: Internal Medicine

## 2020-06-05 ENCOUNTER — Other Ambulatory Visit: Payer: Self-pay

## 2020-06-05 DIAGNOSIS — R1310 Dysphagia, unspecified: Secondary | ICD-10-CM | POA: Insufficient documentation

## 2020-06-05 DIAGNOSIS — R059 Cough, unspecified: Secondary | ICD-10-CM | POA: Diagnosis not present

## 2020-06-06 DIAGNOSIS — E1122 Type 2 diabetes mellitus with diabetic chronic kidney disease: Secondary | ICD-10-CM | POA: Diagnosis not present

## 2020-06-06 DIAGNOSIS — E7800 Pure hypercholesterolemia, unspecified: Secondary | ICD-10-CM | POA: Diagnosis not present

## 2020-06-06 DIAGNOSIS — E119 Type 2 diabetes mellitus without complications: Secondary | ICD-10-CM | POA: Diagnosis not present

## 2020-06-06 DIAGNOSIS — N183 Chronic kidney disease, stage 3 unspecified: Secondary | ICD-10-CM | POA: Diagnosis not present

## 2020-06-06 DIAGNOSIS — I10 Essential (primary) hypertension: Secondary | ICD-10-CM | POA: Diagnosis not present

## 2020-06-21 DIAGNOSIS — E1122 Type 2 diabetes mellitus with diabetic chronic kidney disease: Secondary | ICD-10-CM | POA: Diagnosis not present

## 2020-06-21 DIAGNOSIS — R059 Cough, unspecified: Secondary | ICD-10-CM | POA: Diagnosis not present

## 2020-06-21 DIAGNOSIS — K2100 Gastro-esophageal reflux disease with esophagitis, without bleeding: Secondary | ICD-10-CM | POA: Diagnosis not present

## 2020-07-04 DIAGNOSIS — Z01812 Encounter for preprocedural laboratory examination: Secondary | ICD-10-CM | POA: Diagnosis not present

## 2020-07-09 DIAGNOSIS — K2930 Chronic superficial gastritis without bleeding: Secondary | ICD-10-CM | POA: Diagnosis not present

## 2020-07-09 DIAGNOSIS — Q399 Congenital malformation of esophagus, unspecified: Secondary | ICD-10-CM | POA: Diagnosis not present

## 2020-07-09 DIAGNOSIS — Z9889 Other specified postprocedural states: Secondary | ICD-10-CM | POA: Diagnosis not present

## 2020-07-09 DIAGNOSIS — R051 Acute cough: Secondary | ICD-10-CM | POA: Diagnosis not present

## 2020-07-09 DIAGNOSIS — K3189 Other diseases of stomach and duodenum: Secondary | ICD-10-CM | POA: Diagnosis not present

## 2020-07-16 DIAGNOSIS — K2930 Chronic superficial gastritis without bleeding: Secondary | ICD-10-CM | POA: Diagnosis not present

## 2020-07-31 DIAGNOSIS — E119 Type 2 diabetes mellitus without complications: Secondary | ICD-10-CM | POA: Diagnosis not present

## 2020-07-31 DIAGNOSIS — H2513 Age-related nuclear cataract, bilateral: Secondary | ICD-10-CM | POA: Diagnosis not present

## 2020-07-31 DIAGNOSIS — H40033 Anatomical narrow angle, bilateral: Secondary | ICD-10-CM | POA: Diagnosis not present

## 2020-07-31 DIAGNOSIS — H40023 Open angle with borderline findings, high risk, bilateral: Secondary | ICD-10-CM | POA: Diagnosis not present

## 2020-07-31 DIAGNOSIS — H524 Presbyopia: Secondary | ICD-10-CM | POA: Diagnosis not present

## 2020-08-02 DIAGNOSIS — N1832 Chronic kidney disease, stage 3b: Secondary | ICD-10-CM | POA: Diagnosis not present

## 2020-08-02 DIAGNOSIS — E1122 Type 2 diabetes mellitus with diabetic chronic kidney disease: Secondary | ICD-10-CM | POA: Diagnosis not present

## 2020-08-02 DIAGNOSIS — E7800 Pure hypercholesterolemia, unspecified: Secondary | ICD-10-CM | POA: Diagnosis not present

## 2020-08-02 DIAGNOSIS — E119 Type 2 diabetes mellitus without complications: Secondary | ICD-10-CM | POA: Diagnosis not present

## 2020-08-02 DIAGNOSIS — I10 Essential (primary) hypertension: Secondary | ICD-10-CM | POA: Diagnosis not present

## 2020-08-29 DIAGNOSIS — E7800 Pure hypercholesterolemia, unspecified: Secondary | ICD-10-CM | POA: Diagnosis not present

## 2020-08-29 DIAGNOSIS — N183 Chronic kidney disease, stage 3 unspecified: Secondary | ICD-10-CM | POA: Diagnosis not present

## 2020-08-29 DIAGNOSIS — E1122 Type 2 diabetes mellitus with diabetic chronic kidney disease: Secondary | ICD-10-CM | POA: Diagnosis not present

## 2020-08-29 DIAGNOSIS — N1832 Chronic kidney disease, stage 3b: Secondary | ICD-10-CM | POA: Diagnosis not present

## 2020-08-29 DIAGNOSIS — I10 Essential (primary) hypertension: Secondary | ICD-10-CM | POA: Diagnosis not present

## 2020-08-29 DIAGNOSIS — E119 Type 2 diabetes mellitus without complications: Secondary | ICD-10-CM | POA: Diagnosis not present

## 2020-09-10 DIAGNOSIS — N1832 Chronic kidney disease, stage 3b: Secondary | ICD-10-CM | POA: Diagnosis not present

## 2020-09-13 DIAGNOSIS — I10 Essential (primary) hypertension: Secondary | ICD-10-CM | POA: Diagnosis not present

## 2020-09-13 DIAGNOSIS — E7800 Pure hypercholesterolemia, unspecified: Secondary | ICD-10-CM | POA: Diagnosis not present

## 2020-09-13 DIAGNOSIS — E1122 Type 2 diabetes mellitus with diabetic chronic kidney disease: Secondary | ICD-10-CM | POA: Diagnosis not present

## 2020-09-13 DIAGNOSIS — N1832 Chronic kidney disease, stage 3b: Secondary | ICD-10-CM | POA: Diagnosis not present

## 2020-09-13 DIAGNOSIS — E119 Type 2 diabetes mellitus without complications: Secondary | ICD-10-CM | POA: Diagnosis not present

## 2020-09-17 DIAGNOSIS — N1832 Chronic kidney disease, stage 3b: Secondary | ICD-10-CM | POA: Diagnosis not present

## 2020-09-17 DIAGNOSIS — Z87442 Personal history of urinary calculi: Secondary | ICD-10-CM | POA: Diagnosis not present

## 2020-09-17 DIAGNOSIS — I129 Hypertensive chronic kidney disease with stage 1 through stage 4 chronic kidney disease, or unspecified chronic kidney disease: Secondary | ICD-10-CM | POA: Diagnosis not present

## 2020-09-17 DIAGNOSIS — E1122 Type 2 diabetes mellitus with diabetic chronic kidney disease: Secondary | ICD-10-CM | POA: Diagnosis not present

## 2020-09-17 DIAGNOSIS — E559 Vitamin D deficiency, unspecified: Secondary | ICD-10-CM | POA: Diagnosis not present

## 2020-09-17 DIAGNOSIS — R801 Persistent proteinuria, unspecified: Secondary | ICD-10-CM | POA: Diagnosis not present

## 2020-09-17 DIAGNOSIS — E875 Hyperkalemia: Secondary | ICD-10-CM | POA: Diagnosis not present

## 2020-10-04 DIAGNOSIS — N1832 Chronic kidney disease, stage 3b: Secondary | ICD-10-CM | POA: Diagnosis not present

## 2020-10-11 DIAGNOSIS — Z7984 Long term (current) use of oral hypoglycemic drugs: Secondary | ICD-10-CM | POA: Diagnosis not present

## 2020-10-11 DIAGNOSIS — N1832 Chronic kidney disease, stage 3b: Secondary | ICD-10-CM | POA: Diagnosis not present

## 2020-10-11 DIAGNOSIS — R413 Other amnesia: Secondary | ICD-10-CM | POA: Diagnosis not present

## 2020-10-11 DIAGNOSIS — E7800 Pure hypercholesterolemia, unspecified: Secondary | ICD-10-CM | POA: Diagnosis not present

## 2020-10-11 DIAGNOSIS — E1122 Type 2 diabetes mellitus with diabetic chronic kidney disease: Secondary | ICD-10-CM | POA: Diagnosis not present

## 2020-10-11 DIAGNOSIS — Z0000 Encounter for general adult medical examination without abnormal findings: Secondary | ICD-10-CM | POA: Diagnosis not present

## 2020-10-11 DIAGNOSIS — R0989 Other specified symptoms and signs involving the circulatory and respiratory systems: Secondary | ICD-10-CM | POA: Diagnosis not present

## 2020-10-11 DIAGNOSIS — Z1389 Encounter for screening for other disorder: Secondary | ICD-10-CM | POA: Diagnosis not present

## 2020-10-11 DIAGNOSIS — I10 Essential (primary) hypertension: Secondary | ICD-10-CM | POA: Diagnosis not present

## 2020-10-15 ENCOUNTER — Other Ambulatory Visit: Payer: Self-pay | Admitting: Internal Medicine

## 2020-10-15 DIAGNOSIS — R0989 Other specified symptoms and signs involving the circulatory and respiratory systems: Secondary | ICD-10-CM

## 2020-10-18 DIAGNOSIS — N1832 Chronic kidney disease, stage 3b: Secondary | ICD-10-CM | POA: Diagnosis not present

## 2020-10-18 DIAGNOSIS — I10 Essential (primary) hypertension: Secondary | ICD-10-CM | POA: Diagnosis not present

## 2020-10-18 DIAGNOSIS — Z7984 Long term (current) use of oral hypoglycemic drugs: Secondary | ICD-10-CM | POA: Diagnosis not present

## 2020-10-18 DIAGNOSIS — E1122 Type 2 diabetes mellitus with diabetic chronic kidney disease: Secondary | ICD-10-CM | POA: Diagnosis not present

## 2020-10-18 DIAGNOSIS — R413 Other amnesia: Secondary | ICD-10-CM | POA: Diagnosis not present

## 2020-10-22 ENCOUNTER — Other Ambulatory Visit: Payer: Self-pay

## 2020-10-22 ENCOUNTER — Ambulatory Visit (INDEPENDENT_AMBULATORY_CARE_PROVIDER_SITE_OTHER): Payer: Medicare Other | Admitting: Otolaryngology

## 2020-10-22 DIAGNOSIS — J310 Chronic rhinitis: Secondary | ICD-10-CM

## 2020-10-22 DIAGNOSIS — K219 Gastro-esophageal reflux disease without esophagitis: Secondary | ICD-10-CM

## 2020-10-22 NOTE — Progress Notes (Signed)
HPI: Brandon Weaver is a 85 y.o. male who presents is referred by his PCP Dr. Delfina Redwood for evaluation of chronic "sinus drainage" and chronic throat clearing.  He states that over the past 6 months he has been coughing up foam from his throat.  He has been diagnosed with allergies and has been prescribed Allegra and Mucinex in the past as well as Flonase and Zyrtec in the past.  He complains of chronic throat clearing.  He has had upper endoscopy with GI with clear upper esophageal exam.  He has been on antiacid therapy in the past on and off but generally took it first thing in the morning when he takes most of his medications.  He is doing a little bit better presently but still has a tendency to clear his throat as he feels like he has mucus stuck low in the throat and points to the area of the cricoid cartilage and larynx..  Past Medical History:  Diagnosis Date   Arthritis    per pt 05/31/19   CKD (chronic kidney disease), stage III    Complication of anesthesia    Oxygen gets low when under anesthesia   Diabetes ([place])    Has to be on insulin pancrease exploded due to pancreatitis    GERD (gastroesophageal reflux disease)    hx of GERD - had reflux surgery (see surgical hx) per pt 05/31/19   Headache    hx of migraine   History of kidney stones    per pt 05/31/19 "they told me to leave it alone until it gives me problems, so I still got them"    Hypertension    Pneumonia    hx of pneumonia ~10years ago 05/31/19   Sleep apnea    does not tolerate cpap   Past Surgical History:  Procedure Laterality Date   ANTERIOR CERVICAL DECOMP/DISCECTOMY FUSION N/A 06/03/2019   Procedure: ANTERIOR CERVICAL DECOMPRESSION FUSION CERVICAL THREE-FOUR;  Surgeon: Eustace Moore, MD;  Location: [place];  Service: Neurosurgery;  Laterality: N/A;  anterior   DENTAL SURGERY     dental plates put in upper and lower ~7-8 years ago per pt 05/31/19   EYE SURGERY Left    lasik eye surgery "about 15-16 years ago" 05/31/19    L shoulder surgery     muscle repair   reflux surgery     ROBOTIC ADRENALECTOMY Right 03/16/2019   Procedure: XI ROBOTIC ADRENALECTOMY, RENAL CYST DECORTICATION AND NODE DISSECTION;  Surgeon: Alexis Frock, MD;  Location: [facility];  Service: Urology;  Laterality: Right;  3 HRS   TONSILLECTOMY     Social History   Socioeconomic History   Marital status: Married    Spouse name: Not on file   Number of children: Not on file   Years of education: Not on file   Highest education level: Not on file  Occupational History   Not on file  Tobacco Use   Smoking status: Former    Packs/day: 0.50    Years: 30.00    Pack years: 15.00    Types: Cigarettes    Quit date: 05/05/1986    Years since quitting: 34.4   Smokeless tobacco: Never  Vaping Use   Vaping Use: Never used  Substance and Sexual Activity   Alcohol use: Not Currently   Drug use: Never   Sexual activity: Not Currently  Other Topics Concern   Not on file  Social History Narrative   Not on file   Social Determinants  of Health   Financial Resource Strain: Not on file  Food Insecurity: Not on file  Transportation Needs: Not on file  Physical Activity: Not on file  Stress: Not on file  Social Connections: Not on file   Family History  Problem Relation Age of Onset   COPD Brother    COPD Mother    Heart disease Mother    Sleep apnea Son    Cancer - Prostate Brother    Allergies  Allergen Reactions   Laxative [Bisacodyl] Hives    All laxatives caused hives But was able to take magnesium citrate for surgery   Prior to Admission medications   Medication Sig Start Date End Date Taking? Authorizing Provider  amLODipine (NORVASC) 5 MG tablet Take 5 mg by mouth daily. 12/17/18   [provider]  carboxymethylcellulose (REFRESH PLUS) 0.5 % SOLN Place 1-2 drops into both eyes 3 (three) times daily as needed (dry/irritated eyes.).    [provider]  Cholecalciferol (VITAMIN D3) 1.25 MG (50000 UT) CAPS  Take 1 capsule by mouth once a week. 05/16/19   [provider]  cyclobenzaprine (FLEXERIL) 10 MG tablet Take 10 mg by mouth 3 (three) times daily as needed for muscle spasms.  07/05/19   [provider]  diclofenac Sodium (VOLTAREN) 1 % GEL Apply 2 g topically 4 (four) times daily. 09/06/19   Couture, Cortni S, PA-C  HUMULIN 70/30 (70-30) 100 UNIT/ML injection Inject 45 Units into the skin 2 (two) times daily. 01/24/19   [provider]  HYDROcodone-acetaminophen (NORCO/VICODIN) 5-325 MG tablet Take 1 tablet by mouth every 6 (six) hours as needed. 09/06/19   Couture, Cortni S, PA-C  irbesartan (AVAPRO) 300 MG tablet Take 300 mg by mouth daily.    [provider]  methocarbamol (ROBAXIN) 500 MG tablet Take 1 tablet (500 mg total) by mouth every 6 (six) hours as needed for muscle spasms. Patient not taking: Reported on 09/06/2019 06/04/19   Costella, Vista Mink, PA-C  [facility] ULTRA test strip 1 each by Other route in the morning and at bedtime. 07/15/19   [provider]  tamsulosin (FLOMAX) 0.4 MG CAPS capsule Take 0.4 mg by mouth as needed.  03/14/19   [provider]     Positive ROS: Otherwise negative  All other systems have been reviewed and were otherwise negative with the exception of those mentioned in the HPI and as above.  Physical Exam: Constitutional: Alert, well-appearing, no acute distress Ears: External ears without lesions or tenderness. Ear canals are clear bilaterally with intact, clear TMs.  Nasal: External nose without lesions. Septum is relatively midline with mild rhinitis..  On nasal endoscopy the middle meatus regions are clear bilaterally with no polyps and no mucopurulent discharge.  Minimal clear mucus discharge within the nasal cavity.  The nasopharynx was clear. Oral: Lips and gums without lesions. Tongue and palate mucosa without lesions. Posterior oropharynx clear.  No significant mucus noted in the oropharynx. Fiberoptic  laryngoscopy was performed through the right nostril.  The nasopharynx was clear.  The base of tongue vallecula and epiglottis were normal.  Vocal cords were clear bilaterally with normal vocal mobility.  Both piriform sinuses were clear.  He had mild edema of the arytenoid mucosa but no mucosal lesions noted. Neck: No palpable adenopathy or masses.  No palpable thyroid nodules or lymphadenopathy noted in neck. Respiratory: Breathing comfortably  Skin: No facial/neck lesions or rash noted.  Laryngoscopy  Date/Time: 10/22/2020 5:33 PM Performed by: Melony Overly  E, MD Authorized by: Rozetta Nunnery, MD   Consent:    Consent obtained:  Verbal   Consent given by:  Patient Procedure details:    Indications: direct visualization of the upper aerodigestive tract     Medication:  Afrin   Instrument: flexible fiberoptic laryngoscope     Scope location: right nare   Sinus:    Right nasopharynx: normal   Mouth:    Oropharynx: normal     Vallecula: normal     Base of tongue: normal     Epiglottis: normal   Throat:    Pyriform sinus: normal     True vocal cords: normal   Comments:     On fiberoptic laryngoscopy the hypopharynx and larynx was clear with no mucosal abnormalities noted.  He had mild arytenoid edema consistent with possible reflux type symptoms but no lesions noted.  Assessment: Chronic throat clearing I suspect is probably more related to GE reflux disease. Mild rhinitis.  Plan: For any nasal symptoms recommended use of Flonase 2 sprays each nostril at night as well as saline rinses during the daytime for any postnasal drainage. Prescribed omeprazole 40 mg daily before dinner to take on a regular basis for 4 weeks if he has chronic throat clearing to see if this would help.  Reassured him of normal upper airway examination otherwise.   Radene Journey, MD   CC:

## 2020-10-23 DIAGNOSIS — N1832 Chronic kidney disease, stage 3b: Secondary | ICD-10-CM | POA: Diagnosis not present

## 2020-10-24 ENCOUNTER — Ambulatory Visit
Admission: RE | Admit: 2020-10-24 | Discharge: 2020-10-24 | Disposition: A | Discharge status: Home / Self Care | Payer: Medicare Other | Source: Ambulatory Visit | Attending: Internal Medicine | Admitting: Internal Medicine

## 2020-10-24 DIAGNOSIS — R0989 Other specified symptoms and signs involving the circulatory and respiratory systems: Secondary | ICD-10-CM

## 2020-11-21 DIAGNOSIS — N183 Chronic kidney disease, stage 3 unspecified: Secondary | ICD-10-CM | POA: Diagnosis not present

## 2020-11-21 DIAGNOSIS — N1832 Chronic kidney disease, stage 3b: Secondary | ICD-10-CM | POA: Diagnosis not present

## 2020-11-21 DIAGNOSIS — I10 Essential (primary) hypertension: Secondary | ICD-10-CM | POA: Diagnosis not present

## 2020-11-21 DIAGNOSIS — E119 Type 2 diabetes mellitus without complications: Secondary | ICD-10-CM | POA: Diagnosis not present

## 2020-11-21 DIAGNOSIS — E7800 Pure hypercholesterolemia, unspecified: Secondary | ICD-10-CM | POA: Diagnosis not present

## 2020-11-21 DIAGNOSIS — E1122 Type 2 diabetes mellitus with diabetic chronic kidney disease: Secondary | ICD-10-CM | POA: Diagnosis not present

## 2020-12-02 DIAGNOSIS — U071 COVID-19: Secondary | ICD-10-CM | POA: Diagnosis not present

## 2020-12-10 DIAGNOSIS — N1832 Chronic kidney disease, stage 3b: Secondary | ICD-10-CM | POA: Diagnosis not present

## 2020-12-19 DIAGNOSIS — E875 Hyperkalemia: Secondary | ICD-10-CM | POA: Diagnosis not present

## 2020-12-19 DIAGNOSIS — E559 Vitamin D deficiency, unspecified: Secondary | ICD-10-CM | POA: Diagnosis not present

## 2020-12-19 DIAGNOSIS — R801 Persistent proteinuria, unspecified: Secondary | ICD-10-CM | POA: Diagnosis not present

## 2020-12-19 DIAGNOSIS — I129 Hypertensive chronic kidney disease with stage 1 through stage 4 chronic kidney disease, or unspecified chronic kidney disease: Secondary | ICD-10-CM | POA: Diagnosis not present

## 2020-12-19 DIAGNOSIS — N1832 Chronic kidney disease, stage 3b: Secondary | ICD-10-CM | POA: Diagnosis not present

## 2020-12-19 DIAGNOSIS — E1122 Type 2 diabetes mellitus with diabetic chronic kidney disease: Secondary | ICD-10-CM | POA: Diagnosis not present

## 2020-12-19 DIAGNOSIS — Z87442 Personal history of urinary calculi: Secondary | ICD-10-CM | POA: Diagnosis not present

## 2021-01-17 ENCOUNTER — Other Ambulatory Visit (INDEPENDENT_AMBULATORY_CARE_PROVIDER_SITE_OTHER): Payer: Self-pay | Admitting: Otolaryngology

## 2021-01-22 DIAGNOSIS — H40033 Anatomical narrow angle, bilateral: Secondary | ICD-10-CM | POA: Diagnosis not present

## 2021-01-22 DIAGNOSIS — H35363 Drusen (degenerative) of macula, bilateral: Secondary | ICD-10-CM | POA: Diagnosis not present

## 2021-01-22 DIAGNOSIS — H35033 Hypertensive retinopathy, bilateral: Secondary | ICD-10-CM | POA: Diagnosis not present

## 2021-01-22 DIAGNOSIS — H40023 Open angle with borderline findings, high risk, bilateral: Secondary | ICD-10-CM | POA: Diagnosis not present

## 2021-01-22 DIAGNOSIS — H35373 Puckering of macula, bilateral: Secondary | ICD-10-CM | POA: Diagnosis not present

## 2021-01-22 DIAGNOSIS — Z794 Long term (current) use of insulin: Secondary | ICD-10-CM | POA: Diagnosis not present

## 2021-01-23 DIAGNOSIS — E7800 Pure hypercholesterolemia, unspecified: Secondary | ICD-10-CM | POA: Diagnosis not present

## 2021-01-29 DIAGNOSIS — E1122 Type 2 diabetes mellitus with diabetic chronic kidney disease: Secondary | ICD-10-CM | POA: Diagnosis not present

## 2021-01-29 DIAGNOSIS — E7800 Pure hypercholesterolemia, unspecified: Secondary | ICD-10-CM | POA: Diagnosis not present

## 2021-01-29 DIAGNOSIS — E119 Type 2 diabetes mellitus without complications: Secondary | ICD-10-CM | POA: Diagnosis not present

## 2021-01-29 DIAGNOSIS — I10 Essential (primary) hypertension: Secondary | ICD-10-CM | POA: Diagnosis not present

## 2021-01-29 DIAGNOSIS — H40031 Anatomical narrow angle, right eye: Secondary | ICD-10-CM | POA: Diagnosis not present

## 2021-01-29 DIAGNOSIS — N1832 Chronic kidney disease, stage 3b: Secondary | ICD-10-CM | POA: Diagnosis not present

## 2021-02-12 DIAGNOSIS — H40032 Anatomical narrow angle, left eye: Secondary | ICD-10-CM | POA: Diagnosis not present

## 2021-03-08 DIAGNOSIS — H20013 Primary iridocyclitis, bilateral: Secondary | ICD-10-CM | POA: Diagnosis not present

## 2021-03-26 DIAGNOSIS — N1832 Chronic kidney disease, stage 3b: Secondary | ICD-10-CM | POA: Diagnosis not present

## 2021-03-26 DIAGNOSIS — E1122 Type 2 diabetes mellitus with diabetic chronic kidney disease: Secondary | ICD-10-CM | POA: Diagnosis not present

## 2021-03-26 DIAGNOSIS — I10 Essential (primary) hypertension: Secondary | ICD-10-CM | POA: Diagnosis not present

## 2021-03-26 DIAGNOSIS — E7800 Pure hypercholesterolemia, unspecified: Secondary | ICD-10-CM | POA: Diagnosis not present

## 2021-04-08 DIAGNOSIS — N1832 Chronic kidney disease, stage 3b: Secondary | ICD-10-CM | POA: Diagnosis not present

## 2021-04-09 DIAGNOSIS — H20013 Primary iridocyclitis, bilateral: Secondary | ICD-10-CM | POA: Diagnosis not present

## 2021-04-16 DIAGNOSIS — N184 Chronic kidney disease, stage 4 (severe): Secondary | ICD-10-CM | POA: Diagnosis not present

## 2021-04-16 DIAGNOSIS — Z87442 Personal history of urinary calculi: Secondary | ICD-10-CM | POA: Diagnosis not present

## 2021-04-16 DIAGNOSIS — R801 Persistent proteinuria, unspecified: Secondary | ICD-10-CM | POA: Diagnosis not present

## 2021-04-16 DIAGNOSIS — E559 Vitamin D deficiency, unspecified: Secondary | ICD-10-CM | POA: Diagnosis not present

## 2021-04-16 DIAGNOSIS — E875 Hyperkalemia: Secondary | ICD-10-CM | POA: Diagnosis not present

## 2021-04-16 DIAGNOSIS — I129 Hypertensive chronic kidney disease with stage 1 through stage 4 chronic kidney disease, or unspecified chronic kidney disease: Secondary | ICD-10-CM | POA: Diagnosis not present

## 2021-04-16 DIAGNOSIS — E1122 Type 2 diabetes mellitus with diabetic chronic kidney disease: Secondary | ICD-10-CM | POA: Diagnosis not present

## 2021-04-18 DIAGNOSIS — I10 Essential (primary) hypertension: Secondary | ICD-10-CM | POA: Diagnosis not present

## 2021-04-18 DIAGNOSIS — N1832 Chronic kidney disease, stage 3b: Secondary | ICD-10-CM | POA: Diagnosis not present

## 2021-04-18 DIAGNOSIS — G720 Drug-induced myopathy: Secondary | ICD-10-CM | POA: Diagnosis not present

## 2021-04-18 DIAGNOSIS — E7800 Pure hypercholesterolemia, unspecified: Secondary | ICD-10-CM | POA: Diagnosis not present

## 2021-04-18 DIAGNOSIS — E1122 Type 2 diabetes mellitus with diabetic chronic kidney disease: Secondary | ICD-10-CM | POA: Diagnosis not present

## 2021-04-18 DIAGNOSIS — G4733 Obstructive sleep apnea (adult) (pediatric): Secondary | ICD-10-CM | POA: Diagnosis not present

## 2021-04-18 DIAGNOSIS — G479 Sleep disorder, unspecified: Secondary | ICD-10-CM | POA: Diagnosis not present

## 2021-04-18 DIAGNOSIS — Z7984 Long term (current) use of oral hypoglycemic drugs: Secondary | ICD-10-CM | POA: Diagnosis not present

## 2021-04-18 DIAGNOSIS — Z794 Long term (current) use of insulin: Secondary | ICD-10-CM | POA: Diagnosis not present

## 2021-04-25 DIAGNOSIS — I10 Essential (primary) hypertension: Secondary | ICD-10-CM | POA: Diagnosis not present

## 2021-04-25 DIAGNOSIS — N1832 Chronic kidney disease, stage 3b: Secondary | ICD-10-CM | POA: Diagnosis not present

## 2021-04-25 DIAGNOSIS — E119 Type 2 diabetes mellitus without complications: Secondary | ICD-10-CM | POA: Diagnosis not present

## 2021-04-25 DIAGNOSIS — E1122 Type 2 diabetes mellitus with diabetic chronic kidney disease: Secondary | ICD-10-CM | POA: Diagnosis not present

## 2021-04-25 DIAGNOSIS — E7800 Pure hypercholesterolemia, unspecified: Secondary | ICD-10-CM | POA: Diagnosis not present

## 2021-05-10 DIAGNOSIS — H524 Presbyopia: Secondary | ICD-10-CM | POA: Diagnosis not present

## 2021-05-10 DIAGNOSIS — H20013 Primary iridocyclitis, bilateral: Secondary | ICD-10-CM | POA: Diagnosis not present

## 2021-05-14 DIAGNOSIS — I10 Essential (primary) hypertension: Secondary | ICD-10-CM | POA: Diagnosis not present

## 2021-07-09 DIAGNOSIS — N184 Chronic kidney disease, stage 4 (severe): Secondary | ICD-10-CM | POA: Diagnosis not present

## 2021-07-10 ENCOUNTER — Encounter (HOSPITAL_COMMUNITY): Payer: Self-pay

## 2021-07-10 ENCOUNTER — Other Ambulatory Visit: Payer: Self-pay

## 2021-07-10 ENCOUNTER — Ambulatory Visit (INDEPENDENT_AMBULATORY_CARE_PROVIDER_SITE_OTHER): Payer: Medicare Other

## 2021-07-10 ENCOUNTER — Ambulatory Visit (HOSPITAL_COMMUNITY)
Admission: EM | Admit: 2021-07-10 | Discharge: 2021-07-10 | Disposition: A | Discharge status: Home / Self Care | Payer: Medicare Other | Attending: Family Medicine | Admitting: Family Medicine

## 2021-07-10 DIAGNOSIS — R0789 Other chest pain: Secondary | ICD-10-CM

## 2021-07-10 DIAGNOSIS — R0602 Shortness of breath: Secondary | ICD-10-CM | POA: Diagnosis not present

## 2021-07-10 DIAGNOSIS — R059 Cough, unspecified: Secondary | ICD-10-CM

## 2021-07-10 DIAGNOSIS — R053 Chronic cough: Secondary | ICD-10-CM | POA: Diagnosis not present

## 2021-07-10 DIAGNOSIS — H40033 Anatomical narrow angle, bilateral: Secondary | ICD-10-CM | POA: Diagnosis not present

## 2021-07-10 DIAGNOSIS — E119 Type 2 diabetes mellitus without complications: Secondary | ICD-10-CM | POA: Diagnosis not present

## 2021-07-10 DIAGNOSIS — H25813 Combined forms of age-related cataract, bilateral: Secondary | ICD-10-CM | POA: Diagnosis not present

## 2021-07-10 DIAGNOSIS — H40023 Open angle with borderline findings, high risk, bilateral: Secondary | ICD-10-CM | POA: Diagnosis not present

## 2021-07-10 DIAGNOSIS — H20013 Primary iridocyclitis, bilateral: Secondary | ICD-10-CM | POA: Diagnosis not present

## 2021-07-10 MED ORDER — BENZONATATE 100 MG PO CAPS: 100.0000 mg | ORAL_CAPSULE | Freq: Three times a day (TID) | ORAL | 0 refills | Status: DC

## 2021-07-10 MED ORDER — DOXYCYCLINE HYCLATE 100 MG PO CAPS: 100.0000 mg | ORAL_CAPSULE | Freq: Two times a day (BID) | ORAL | 0 refills | Status: DC

## 2021-07-10 NOTE — ED Triage Notes (Signed)
Pt reports he recently had a cold and has some congestion. Pt states at time he has pain to his chest (intermittent) and it feels like there is something blocking his lung from fully inflating.  ?

## 2021-07-10 NOTE — ED Provider Notes (Signed)
[place]    CSN: [id number] Arrival date & time: 07/10/21  1041      History   Chief Complaint Chief Complaint  Patient presents with   Nasal Congestion   Chest Pain    HPI Brandon Weaver is a 86 y.o. male.   He is here for uri symptoms.  He has had a cough for about 3 months, he feels chest congestion, and feels pain at times.  He feels he cannot get all his breath in, starts coughing, and coughs up a lot of phlegm.  He is then able to breath okay.  Also with sinus congestion, drainage.  He describes just pain when he coughs every now and then.  4/10 in pain at times.  His biggest concern is the difficulty breathing b/c something feels like it is getting stuck.  He was taking otc medication, mucinex, without any help.   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    GERD (gastroesophageal reflux disease)    hx of GERD - had reflux surgery (see surgical hx) per pt 05/31/19   Headache    hx of migraine   History of kidney stones    per pt 05/31/19 "they told me to leave it alone until it gives me problems, so I still got them"    Hypertension    Pneumonia    hx of pneumonia ~10years ago 05/31/19   Sleep apnea    does not tolerate cpap    Patient Active Problem List   Diagnosis Date Noted   S/P cervical spinal fusion 06/03/2019   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   OSA (obstructive sleep apnea) 03/01/2014    Past Surgical History:  Procedure Laterality Date   ANTERIOR CERVICAL DECOMP/DISCECTOMY FUSION N/A 06/03/2019   Procedure: ANTERIOR CERVICAL DECOMPRESSION FUSION CERVICAL THREE-FOUR;  Surgeon: Eustace Moore, MD;  Location: [facility];  Service: Neurosurgery;  Laterality: N/A;  anterior   DENTAL SURGERY      dental plates put in upper and lower ~7-8 years ago per pt 05/31/19   EYE SURGERY Left    lasik eye surgery "about 15-16 years ago" 05/31/19   L shoulder surgery     muscle repair   reflux surgery     ROBOTIC ADRENALECTOMY Right 03/16/2019   Procedure: XI ROBOTIC ADRENALECTOMY, RENAL CYST DECORTICATION AND NODE DISSECTION;  Surgeon: Alexis Frock, MD;  Location: [facility];  Service: Urology;  Laterality: Right;  3 HRS   TONSILLECTOMY         Home Medications    Prior to Admission medications   Medication Sig Start Date End Date Taking? Authorizing Provider  amLODipine (NORVASC) 5 MG tablet Take 5 mg by mouth daily. 12/17/18   [provider]  carboxymethylcellulose (REFRESH PLUS) 0.5 % SOLN Place 1-2 drops into both eyes 3 (three) times daily as needed (dry/irritated eyes.).    [provider]  Cholecalciferol (VITAMIN D3) 1.25 MG (50000 UT) CAPS Take 1 capsule by mouth once a week. 05/16/19   [provider]  cyclobenzaprine (FLEXERIL) 10 MG tablet Take 10 mg by mouth 3 (three) times daily as needed for muscle spasms.  07/05/19   [provider]  diclofenac Sodium (VOLTAREN) 1 % GEL Apply 2 g topically 4 (four) times daily. 09/06/19   Couture, Cortni S, PA-C  FARXIGA 10 MG TABS tablet Take 10 mg by mouth daily. 06/14/21   [provider]  HUMULIN 70/30 (70-30) 100 UNIT/ML injection Inject 45 Units into the skin 2 (two) times daily. 01/24/19   [provider]  HYDROcodone-acetaminophen (NORCO/VICODIN) 5-325 MG tablet Take 1 tablet by mouth every 6 (six) hours as needed. 09/06/19   Couture, Cortni S, PA-C  irbesartan (AVAPRO) 300 MG tablet Take 300 mg by mouth daily.    [provider]  methocarbamol (ROBAXIN) 500 MG tablet Take 1 tablet (500 mg total) by mouth every 6 (six) hours as needed for muscle spasms. Patient not taking: Reported on 09/06/2019 06/04/19   Traci Sermon, PA-C  metoprolol succinate (TOPROL-XL) 25 MG 24 hr tablet  Take 25 mg by mouth daily. 06/14/21   [provider]  [facility] ULTRA test strip 1 each by Other route in the morning and at bedtime. 07/15/19   [provider]  pravastatin (PRAVACHOL) 10 MG tablet Take 10 mg by mouth at bedtime. 07/08/21   [provider]  tamsulosin (FLOMAX) 0.4 MG CAPS capsule Take 0.4 mg by mouth as needed.  03/14/19   [provider]    Family History Family History  Problem Relation Age of Onset   COPD Brother    COPD Mother    Heart disease Mother    Sleep apnea Son    Cancer - Prostate Brother     Social History Social History   Tobacco Use   Smoking status: Former    Packs/day: 0.50    Years: 30.00    Pack years: 15.00    Types: Cigarettes    Quit date: 05/05/1986    Years since quitting: 35.2   Smokeless tobacco: Never  Vaping Use   Vaping Use: Never used  Substance Use Topics   Alcohol use: Not Currently   Drug use: Never     Allergies   Laxative [bisacodyl]   Review of Systems Review of Systems  Constitutional:  Negative for appetite change, fatigue and fever.  HENT:  Positive for congestion and rhinorrhea. Negative for sore throat.   Respiratory:  Positive for cough and chest tightness. Negative for wheezing.   Cardiovascular:  Positive for chest pain.  Gastrointestinal: Negative.   Genitourinary: Negative.     Physical Exam Triage Vital Signs ED Triage Vitals  Enc Vitals Group     BP 07/10/21 1123 (!) 148/66     Pulse Rate 07/10/21 1123 66     Resp 07/10/21 1123 18     Temp 07/10/21 1123 98.2 F (36.8 C)     Temp Source 07/10/21 1123 Oral     SpO2 07/10/21 1123 100 %     Weight --      Height --      Head Circumference --      Peak Flow --      Pain Score 07/10/21 1121 5     Pain Loc --      Pain Edu? --      Excl. in [place]? --    No data found.  Updated Vital Signs BP (!) 148/66 (BP Location: Left Arm)    Pulse 66    Temp 98.2 F (36.8 C) (Oral)    Resp 18    SpO2 100%   Visual  Acuity Right Eye Distance:  Left Eye Distance:   Bilateral Distance:    Right Eye Near:   Left Eye Near:    Bilateral Near:     Physical Exam Constitutional:      Appearance: Normal appearance.  HENT:     Head: Normocephalic and atraumatic.     Mouth/Throat:     Mouth: Mucous membranes are moist.  Cardiovascular:     Rate and Rhythm: Normal rate and regular rhythm.     Heart sounds: Normal heart sounds.  Pulmonary:     Effort: Pulmonary effort is normal.     Breath sounds: Normal breath sounds.  Musculoskeletal:     Cervical back: Normal range of motion and neck supple. No tenderness.  Neurological:     Mental Status: He is alert.     UC Treatments / Results  Labs (all labs ordered are listed, but only abnormal results are displayed) Labs Reviewed - No data to display  EKG Sinus rhythm;  PACs;     Radiology DG Chest 2 View  Result Date: 07/10/2021 CLINICAL DATA:  Cough and shortness of breath. EXAM: CHEST - 2 VIEW COMPARISON:  May 17, 2020 FINDINGS: Tortuosity and calcific atherosclerotic disease of the aorta. Cardiomediastinal silhouette is normal. Mediastinal contours appear intact. There is no evidence of focal airspace consolidation, pleural effusion or pneumothorax. Osseous structures are without acute abnormality. Soft tissues are grossly normal. IMPRESSION: 1. No active cardiopulmonary disease. 2. 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 Electronically Signed   By: Fidela Salisbury M.D.   On: 07/10/2021 11:54    Procedures Procedures (including critical care time)  Medications Ordered in UC Medications - No data to display  Initial Impression / Assessment and Plan / UC Course  I have reviewed the triage vital signs and the nursing notes.  Pertinent labs & imaging results that were available during my care of the patient were reviewed by me and considered in my medical decision making (see chart for details).  Patient was seen  today for cough x 3 months, and difficulty breathing at times.  His chest xray was normal.  EKG was done due to chest pain while coughing, which was not concerning.  Given he has had a cough x 3 months, I will treat with doxy and cough medication to see if helps.  If cough continues, then he should follow up with his primary care provider.   Final Clinical Impressions(s) / UC Diagnoses   Final diagnoses:  Chronic cough  SOB (shortness of breath)  Other chest pain     Discharge Instructions      You were seen today for 3 months of cough, with difficulty breathing at times.  Your EKG does not look concerning today.  Your chest xray overall looked okay without obvious pneumonia or mass.  Given you cough x 3 months, I have sent out an antibiotic to see if this helps, as well as a cough medication.  If you continue to have cough or difficulty breathing then please return, or follow up with your primary care provider.     ED Prescriptions     Medication Sig Dispense Auth. Provider   doxycycline (VIBRAMYCIN) 100 MG capsule Take 1 capsule (100 mg total) by mouth 2 (two) times daily. 20 capsule Yesha Muchow, MD   benzonatate (TESSALON) 100 MG capsule Take 1 capsule (100 mg total) by mouth every 8 (eight) hours. 21 capsule Rondel Oh, MD      PDMP not reviewed this encounter.  Rondel Oh, MD 07/10/21 1214

## 2021-07-10 NOTE — Discharge Instructions (Addendum)
You were seen today for 3 months of cough, with difficulty breathing at times.  Your EKG does not look concerning today.  ?Your chest xray overall looked okay without obvious pneumonia or mass.  ?Given you cough x 3 months, I have sent out an antibiotic to see if this helps, as well as a cough medication.  If you continue to have cough or difficulty breathing then please return, or follow up with your primary care provider.  ?

## 2021-07-15 DIAGNOSIS — Z87442 Personal history of urinary calculi: Secondary | ICD-10-CM | POA: Diagnosis not present

## 2021-07-15 DIAGNOSIS — R801 Persistent proteinuria, unspecified: Secondary | ICD-10-CM | POA: Diagnosis not present

## 2021-07-15 DIAGNOSIS — E559 Vitamin D deficiency, unspecified: Secondary | ICD-10-CM | POA: Diagnosis not present

## 2021-07-15 DIAGNOSIS — E875 Hyperkalemia: Secondary | ICD-10-CM | POA: Diagnosis not present

## 2021-07-15 DIAGNOSIS — I129 Hypertensive chronic kidney disease with stage 1 through stage 4 chronic kidney disease, or unspecified chronic kidney disease: Secondary | ICD-10-CM | POA: Diagnosis not present

## 2021-07-15 DIAGNOSIS — N184 Chronic kidney disease, stage 4 (severe): Secondary | ICD-10-CM | POA: Diagnosis not present

## 2021-07-15 DIAGNOSIS — E1122 Type 2 diabetes mellitus with diabetic chronic kidney disease: Secondary | ICD-10-CM | POA: Diagnosis not present

## 2021-08-02 DIAGNOSIS — E7800 Pure hypercholesterolemia, unspecified: Secondary | ICD-10-CM | POA: Diagnosis not present

## 2021-08-02 DIAGNOSIS — E1122 Type 2 diabetes mellitus with diabetic chronic kidney disease: Secondary | ICD-10-CM | POA: Diagnosis not present

## 2021-08-02 DIAGNOSIS — I10 Essential (primary) hypertension: Secondary | ICD-10-CM | POA: Diagnosis not present

## 2021-10-01 DIAGNOSIS — N1832 Chronic kidney disease, stage 3b: Secondary | ICD-10-CM | POA: Diagnosis not present

## 2021-10-09 DIAGNOSIS — N1832 Chronic kidney disease, stage 3b: Secondary | ICD-10-CM | POA: Diagnosis not present

## 2021-10-09 DIAGNOSIS — E875 Hyperkalemia: Secondary | ICD-10-CM | POA: Diagnosis not present

## 2021-10-09 DIAGNOSIS — E1122 Type 2 diabetes mellitus with diabetic chronic kidney disease: Secondary | ICD-10-CM | POA: Diagnosis not present

## 2021-10-09 DIAGNOSIS — E559 Vitamin D deficiency, unspecified: Secondary | ICD-10-CM | POA: Diagnosis not present

## 2021-10-09 DIAGNOSIS — Z87442 Personal history of urinary calculi: Secondary | ICD-10-CM | POA: Diagnosis not present

## 2021-10-09 DIAGNOSIS — I129 Hypertensive chronic kidney disease with stage 1 through stage 4 chronic kidney disease, or unspecified chronic kidney disease: Secondary | ICD-10-CM | POA: Diagnosis not present

## 2021-10-09 DIAGNOSIS — Q6102 Congenital multiple renal cysts: Secondary | ICD-10-CM | POA: Diagnosis not present

## 2021-10-09 DIAGNOSIS — R801 Persistent proteinuria, unspecified: Secondary | ICD-10-CM | POA: Diagnosis not present

## 2021-10-10 ENCOUNTER — Ambulatory Visit
Admission: RE | Admit: 2021-10-10 | Discharge: 2021-10-10 | Disposition: A | Discharge status: Home / Self Care | Payer: Medicare Other | Source: Ambulatory Visit | Attending: Nephrology | Admitting: Nephrology

## 2021-10-10 ENCOUNTER — Other Ambulatory Visit: Payer: Self-pay | Admitting: Nephrology

## 2021-10-10 DIAGNOSIS — E875 Hyperkalemia: Secondary | ICD-10-CM

## 2021-10-10 DIAGNOSIS — R801 Persistent proteinuria, unspecified: Secondary | ICD-10-CM

## 2021-10-10 DIAGNOSIS — N281 Cyst of kidney, acquired: Secondary | ICD-10-CM | POA: Diagnosis not present

## 2021-10-10 DIAGNOSIS — N1832 Chronic kidney disease, stage 3b: Secondary | ICD-10-CM

## 2021-10-10 DIAGNOSIS — Z87442 Personal history of urinary calculi: Secondary | ICD-10-CM

## 2021-10-10 DIAGNOSIS — N400 Enlarged prostate without lower urinary tract symptoms: Secondary | ICD-10-CM

## 2021-10-10 DIAGNOSIS — I129 Hypertensive chronic kidney disease with stage 1 through stage 4 chronic kidney disease, or unspecified chronic kidney disease: Secondary | ICD-10-CM

## 2021-10-10 DIAGNOSIS — E559 Vitamin D deficiency, unspecified: Secondary | ICD-10-CM

## 2021-10-10 DIAGNOSIS — Q6102 Congenital multiple renal cysts: Secondary | ICD-10-CM

## 2021-10-10 DIAGNOSIS — N189 Chronic kidney disease, unspecified: Secondary | ICD-10-CM | POA: Diagnosis not present

## 2021-10-10 DIAGNOSIS — E1122 Type 2 diabetes mellitus with diabetic chronic kidney disease: Secondary | ICD-10-CM

## 2021-10-17 DIAGNOSIS — Z0000 Encounter for general adult medical examination without abnormal findings: Secondary | ICD-10-CM | POA: Diagnosis not present

## 2021-10-17 DIAGNOSIS — E1122 Type 2 diabetes mellitus with diabetic chronic kidney disease: Secondary | ICD-10-CM | POA: Diagnosis not present

## 2021-10-17 DIAGNOSIS — R0989 Other specified symptoms and signs involving the circulatory and respiratory systems: Secondary | ICD-10-CM | POA: Diagnosis not present

## 2021-10-17 DIAGNOSIS — E7800 Pure hypercholesterolemia, unspecified: Secondary | ICD-10-CM | POA: Diagnosis not present

## 2021-10-17 DIAGNOSIS — G4733 Obstructive sleep apnea (adult) (pediatric): Secondary | ICD-10-CM | POA: Diagnosis not present

## 2021-10-17 DIAGNOSIS — N1832 Chronic kidney disease, stage 3b: Secondary | ICD-10-CM | POA: Diagnosis not present

## 2021-10-17 DIAGNOSIS — I700 Atherosclerosis of aorta: Secondary | ICD-10-CM | POA: Diagnosis not present

## 2021-10-17 DIAGNOSIS — I10 Essential (primary) hypertension: Secondary | ICD-10-CM | POA: Diagnosis not present

## 2021-12-12 ENCOUNTER — Encounter (HOSPITAL_COMMUNITY): Payer: Self-pay

## 2021-12-12 ENCOUNTER — Ambulatory Visit (HOSPITAL_COMMUNITY)
Admission: EM | Admit: 2021-12-12 | Discharge: 2021-12-12 | Disposition: A | Discharge status: Home / Self Care | Payer: Medicare Other | Attending: Physician Assistant | Admitting: Physician Assistant

## 2021-12-12 DIAGNOSIS — R051 Acute cough: Secondary | ICD-10-CM

## 2021-12-12 DIAGNOSIS — J301 Allergic rhinitis due to pollen: Secondary | ICD-10-CM | POA: Diagnosis not present

## 2021-12-12 DIAGNOSIS — J069 Acute upper respiratory infection, unspecified: Secondary | ICD-10-CM | POA: Diagnosis not present

## 2021-12-12 MED ORDER — PREDNISONE 10 MG PO TABS: 10.0000 mg | ORAL_TABLET | Freq: Three times a day (TID) | ORAL | 0 refills | Status: DC

## 2021-12-12 MED ORDER — DM-GUAIFENESIN ER 30-600 MG PO TB12: 1.0000 | ORAL_TABLET | Freq: Two times a day (BID) | ORAL | 0 refills | Status: DC

## 2021-12-12 MED ORDER — AZELASTINE HCL 0.1 % NA SOLN: 2.0000 | Freq: Two times a day (BID) | NASAL | 12 refills | Status: DC

## 2021-12-12 NOTE — Discharge Instructions (Addendum)
Advised to use Astelin nasal spray 2 sprays each nostril twice a day in order to decrease the postnasal drip and rhinitis. Advised to use Mucinex DM 1 tablet twice daily in order to control a cough and congestion. Advised to take prednisone 10 mg 3 times a day for 3 days only to help with the sinus congestion. Advised to follow-up with PCP or return to urgent care if symptoms fail to improve.

## 2021-12-12 NOTE — ED Provider Notes (Signed)
[place]    CSN: [id number] Arrival date & time: 12/12/21  1238      History   Chief Complaint Chief Complaint  Patient presents with   Cough    Pt has a cough x few months    HPI Brandon Weaver is a 86 y.o. male.   86 year old male presents with cough and runny nose [place] for the past several months he has had persistent runny nose and postnasal drip, this has become worse over the past couple weeks.  Patient indicates that the rhinitis is mainly clear, postnasal drip is making cough on a regular basis and is hard to form to bring the sputum up but it is also clear to light yellow.  He indicates he has been taking Xyzal and Claritin over-the-counter but this has not given him much benefit from his symptoms.  He relates he has not had any fever, chills, shortness of breath or wheezing.  He does indicate that he has had some upper respiratory sinus congestion present.  He has done some nasal sprays in the past but these did not seem to help control his symptoms.   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    GERD (gastroesophageal reflux disease)    hx of GERD - had reflux surgery (see surgical hx) per pt 05/31/19   Headache    hx of migraine   History of kidney stones    per pt 05/31/19 "they told me to leave it alone until it gives me problems, so I still got them"    Hypertension    Pneumonia    hx of pneumonia ~10years ago 05/31/19   Sleep apnea    does not tolerate cpap    Patient Active Problem List   Diagnosis Date Noted   S/P cervical spinal fusion 06/03/2019   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/2019   OSA (obstructive sleep apnea) 03/01/2014    Past Surgical  History:  Procedure Laterality Date   ANTERIOR CERVICAL DECOMP/DISCECTOMY FUSION N/A 06/03/2019   Procedure: ANTERIOR CERVICAL DECOMPRESSION FUSION CERVICAL THREE-FOUR;  Surgeon: Eustace Moore, MD;  Location: [facility];  Service: Neurosurgery;  Laterality: N/A;  anterior   DENTAL SURGERY     dental plates put in upper and lower ~7-8 years ago per pt 05/31/19   EYE SURGERY Left    lasik eye surgery "about 15-16 years ago" 05/31/19   L shoulder surgery     muscle repair   reflux surgery     ROBOTIC ADRENALECTOMY Right 03/16/2019   Procedure: XI ROBOTIC ADRENALECTOMY, RENAL CYST DECORTICATION AND NODE DISSECTION;  Surgeon: Alexis Frock, MD;  Location[facility];  Service: Urology;  Laterality: Right;  3 HRS   TONSILLECTOMY         Home Medications    Prior to Admission medications   Medication Sig Start Date End Date Taking? Authorizing Provider  amLODipine (NORVASC) 5 MG tablet Take 5 mg by mouth daily. 12/17/18   [provider]  azelastine (ASTELIN) 0.1 % nasal spray Place 2 sprays into both nostrils 2 (two) times daily. Use in each nostril as directed 12/12/21  Yes Nyoka Lint, PA-C  benzonatate (TESSALON) 100 MG capsule Take 1 capsule (100 mg total) by mouth every 8 (eight) hours. 07/10/21   Piontek, Junie Panning, MD  carboxymethylcellulose (REFRESH PLUS) 0.5 % SOLN Place 1-2 drops into both eyes 3 (three) times daily as needed (dry/irritated eyes.).    [provider]  Cholecalciferol (VITAMIN D3) 1.25 MG (50000 UT) CAPS Take 1 capsule by mouth once a week. 05/16/19   [provider]  cyclobenzaprine (FLEXERIL) 10 MG tablet Take 10 mg by mouth 3 (three) times daily as needed for muscle spasms.  07/05/19   [provider]  dextromethorphan-guaiFENesin (MUCINEX DM) 30-600 MG 12hr tablet Take 1 tablet by mouth 2 (two) times daily. For cough. 12/12/21  Yes Nyoka Lint, PA-C  diclofenac Sodium (VOLTAREN) 1 % GEL Apply 2 g topically 4 (four) times daily. 09/06/19   Couture,  Cortni S, PA-C  doxycycline (VIBRAMYCIN) 100 MG capsule Take 1 capsule (100 mg total) by mouth 2 (two) times daily. 07/10/21   Piontek, Junie Panning, MD  FARXIGA 10 MG TABS tablet Take 10 mg by mouth daily. 06/14/21   [provider]  HUMULIN 70/30 (70-30) 100 UNIT/ML injection Inject 45 Units into the skin 2 (two) times daily. 01/24/19   [provider]  HYDROcodone-acetaminophen (NORCO/VICODIN) 5-325 MG tablet Take 1 tablet by mouth every 6 (six) hours as needed. 09/06/19   Couture, Cortni S, PA-C  metoprolol succinate (TOPROL-XL) 25 MG 24 hr tablet Take 25 mg by mouth daily. 06/14/21   [provider]  [facility] ULTRA test strip 1 each by Other route in the morning and at bedtime. 07/15/19   [provider]  pravastatin (PRAVACHOL) 10 MG tablet Take 10 mg by mouth at bedtime. 07/08/21   [provider]  predniSONE (DELTASONE) 10 MG tablet Take 1 tablet (10 mg total) by mouth 3 (three) times daily. For congestion and drainage. 12/12/21  Yes Nyoka Lint, PA-C    Family History Family History  Problem Relation Age of Onset   COPD Brother    COPD Mother    Heart disease Mother    Sleep apnea Son    Cancer - Prostate Brother     Social History Social History   Tobacco Use   Smoking status: Former    Packs/day: 0.50    Years: 30.00    Total pack years: 15.00    Types: Cigarettes    Quit date: 05/05/1986    Years since quitting: 35.6   Smokeless tobacco: Never  Vaping Use   Vaping Use: Never used  Substance Use Topics   Alcohol use: Not Currently   Drug use: Never     Allergies   Laxative [bisacodyl]   Review of Systems Review of Systems  HENT:  Positive for postnasal drip and rhinorrhea.   Respiratory:  Positive for cough.      Physical Exam Triage Vital Signs ED Triage Vitals  Enc Vitals Group     BP 12/12/21 1307 (!) 153/72     Pulse Rate 12/12/21 1307 66     Resp 12/12/21 1307 12     Temp 12/12/21 1307 98.2 F (36.8 C)     Temp  Source 12/12/21 1307 Oral     SpO2 12/12/21 1307 96 %     Weight 12/12/21 1308 215 lb (97.5 kg)     Height 12/12/21 1308 '5\' 10"'$  (1.778 m)     Head Circumference --      Peak Flow --  Pain Score --      Pain Loc --      Pain Edu? --      Excl. in [place]? --    No data found.  Updated Vital Signs BP (!) 153/72 (BP Location: Left Arm)   Pulse 66   Temp 98.2 F (36.8 C) (Oral)   Resp 12   Ht '5\' 10"'$  (1.778 m)   Wt 215 lb (97.5 kg)   SpO2 96%   BMI 30.85 kg/m   Visual Acuity Right Eye Distance:   Left Eye Distance:   Bilateral Distance:    Right Eye Near:   Left Eye Near:    Bilateral Near:     Physical Exam Constitutional:      Appearance: Normal appearance.  HENT:     Right Ear: Ear canal normal. Tympanic membrane is injected.     Left Ear: Ear canal normal. Tympanic membrane is injected.     Mouth/Throat:     Mouth: Mucous membranes are moist.     Pharynx: Oropharynx is clear.  Cardiovascular:     Rate and Rhythm: Normal rate and regular rhythm.     Heart sounds: Normal heart sounds.  Pulmonary:     Effort: Pulmonary effort is normal.     Breath sounds: Normal breath sounds and air entry. No wheezing, rhonchi or rales.  Lymphadenopathy:     Cervical: No cervical adenopathy.  Neurological:     Mental Status: He is alert.      UC Treatments / Results  Labs (all labs ordered are listed, but only abnormal results are displayed) Labs Reviewed - No data to display  EKG   Radiology No results found.  Procedures Procedures (including critical care time)  Medications Ordered in UC Medications - No data to display  Initial Impression / Assessment and Plan / UC Course  I have reviewed the triage vital signs and the nursing notes.  Pertinent labs & imaging results that were available during my care of the patient were reviewed by me and considered in my medical decision making (see chart for details).    Plan: 1.  Advised to take the Mucinex DM 1  every 12 hours to help control the cough. 2. Advised to take the prednisone 10 mg 1 3 times a day for 3 days only to help with the sinus congestion postnasal drip. 3.  Advised to use Astelin nasal spray 2 sprays each nostril twice a day to control the runny nose and postnasal drip. 4.  Advised to follow-up with PCP or return to urgent care if symptoms fail to improve. Final Clinical Impressions(s) / UC Diagnoses   Final diagnoses:  Viral upper respiratory tract infection  Seasonal allergic rhinitis due to pollen  Acute cough     Discharge Instructions      Advised to use Astelin nasal spray 2 sprays each nostril twice a day in order to decrease the postnasal drip and rhinitis. Advised to use Mucinex DM 1 tablet twice daily in order to control a cough and congestion. Advised to take prednisone 10 mg 3 times a day for 3 days only to help with the sinus congestion. Advised to follow-up with PCP or return to urgent care if symptoms fail to improve.    ED Prescriptions     Medication Sig Dispense Auth. Provider   azelastine (ASTELIN) 0.1 % nasal spray Place 2 sprays into both nostrils 2 (two) times daily. Use in each nostril as directed 30 mL Nyoka Lint,  PA-C   dextromethorphan-guaiFENesin (MUCINEX DM) 30-600 MG 12hr tablet Take 1 tablet by mouth 2 (two) times daily. For cough. 20 tablet Nyoka Lint, PA-C   predniSONE (DELTASONE) 10 MG tablet Take 1 tablet (10 mg total) by mouth 3 (three) times daily. For congestion and drainage. 10 tablet Nyoka Lint, PA-C      PDMP not reviewed this encounter.   Nyoka Lint, PA-C 12/12/21 1412

## 2021-12-12 NOTE — ED Triage Notes (Signed)
Pt states he has a cough , runny nose x few months . Pt denies fever.

## 2021-12-24 DIAGNOSIS — N183 Chronic kidney disease, stage 3 unspecified: Secondary | ICD-10-CM | POA: Diagnosis not present

## 2021-12-24 DIAGNOSIS — I10 Essential (primary) hypertension: Secondary | ICD-10-CM | POA: Diagnosis not present

## 2021-12-24 DIAGNOSIS — K219 Gastro-esophageal reflux disease without esophagitis: Secondary | ICD-10-CM | POA: Diagnosis not present

## 2021-12-24 DIAGNOSIS — E1122 Type 2 diabetes mellitus with diabetic chronic kidney disease: Secondary | ICD-10-CM | POA: Diagnosis not present

## 2021-12-24 DIAGNOSIS — E7800 Pure hypercholesterolemia, unspecified: Secondary | ICD-10-CM | POA: Diagnosis not present

## 2021-12-26 DIAGNOSIS — K219 Gastro-esophageal reflux disease without esophagitis: Secondary | ICD-10-CM | POA: Diagnosis not present

## 2021-12-26 DIAGNOSIS — R059 Cough, unspecified: Secondary | ICD-10-CM | POA: Diagnosis not present

## 2021-12-26 DIAGNOSIS — J309 Allergic rhinitis, unspecified: Secondary | ICD-10-CM | POA: Diagnosis not present

## 2022-01-27 DIAGNOSIS — K5900 Constipation, unspecified: Secondary | ICD-10-CM | POA: Diagnosis not present

## 2022-02-05 ENCOUNTER — Other Ambulatory Visit: Payer: Self-pay | Admitting: Internal Medicine

## 2022-02-05 ENCOUNTER — Ambulatory Visit
Admission: RE | Admit: 2022-02-05 | Discharge: 2022-02-05 | Disposition: A | Discharge status: Home / Self Care | Payer: Medicare Other | Source: Ambulatory Visit | Attending: Internal Medicine | Admitting: Internal Medicine

## 2022-02-05 DIAGNOSIS — R109 Unspecified abdominal pain: Secondary | ICD-10-CM

## 2022-02-05 DIAGNOSIS — K5900 Constipation, unspecified: Secondary | ICD-10-CM

## 2022-02-05 DIAGNOSIS — K6389 Other specified diseases of intestine: Secondary | ICD-10-CM | POA: Diagnosis not present

## 2022-02-05 DIAGNOSIS — N200 Calculus of kidney: Secondary | ICD-10-CM | POA: Diagnosis not present

## 2022-02-20 DIAGNOSIS — K5904 Chronic idiopathic constipation: Secondary | ICD-10-CM | POA: Diagnosis not present

## 2022-02-25 DIAGNOSIS — K5900 Constipation, unspecified: Secondary | ICD-10-CM | POA: Diagnosis not present

## 2022-03-10 DIAGNOSIS — N184 Chronic kidney disease, stage 4 (severe): Secondary | ICD-10-CM | POA: Diagnosis not present

## 2022-03-18 DIAGNOSIS — Z87442 Personal history of urinary calculi: Secondary | ICD-10-CM | POA: Diagnosis not present

## 2022-03-18 DIAGNOSIS — N184 Chronic kidney disease, stage 4 (severe): Secondary | ICD-10-CM | POA: Diagnosis not present

## 2022-03-18 DIAGNOSIS — E875 Hyperkalemia: Secondary | ICD-10-CM | POA: Diagnosis not present

## 2022-03-18 DIAGNOSIS — E559 Vitamin D deficiency, unspecified: Secondary | ICD-10-CM | POA: Diagnosis not present

## 2022-03-18 DIAGNOSIS — Q6102 Congenital multiple renal cysts: Secondary | ICD-10-CM | POA: Diagnosis not present

## 2022-03-18 DIAGNOSIS — R801 Persistent proteinuria, unspecified: Secondary | ICD-10-CM | POA: Diagnosis not present

## 2022-03-18 DIAGNOSIS — E1122 Type 2 diabetes mellitus with diabetic chronic kidney disease: Secondary | ICD-10-CM | POA: Diagnosis not present

## 2022-03-18 DIAGNOSIS — I129 Hypertensive chronic kidney disease with stage 1 through stage 4 chronic kidney disease, or unspecified chronic kidney disease: Secondary | ICD-10-CM | POA: Diagnosis not present

## 2022-04-18 DIAGNOSIS — E7800 Pure hypercholesterolemia, unspecified: Secondary | ICD-10-CM | POA: Diagnosis not present

## 2022-04-18 DIAGNOSIS — K5904 Chronic idiopathic constipation: Secondary | ICD-10-CM | POA: Diagnosis not present

## 2022-04-18 DIAGNOSIS — E1122 Type 2 diabetes mellitus with diabetic chronic kidney disease: Secondary | ICD-10-CM | POA: Diagnosis not present

## 2022-04-18 DIAGNOSIS — N1832 Chronic kidney disease, stage 3b: Secondary | ICD-10-CM | POA: Diagnosis not present

## 2022-04-18 DIAGNOSIS — I10 Essential (primary) hypertension: Secondary | ICD-10-CM | POA: Diagnosis not present

## 2022-04-18 DIAGNOSIS — G4733 Obstructive sleep apnea (adult) (pediatric): Secondary | ICD-10-CM | POA: Diagnosis not present

## 2022-04-18 DIAGNOSIS — I700 Atherosclerosis of aorta: Secondary | ICD-10-CM | POA: Diagnosis not present

## 2022-05-28 ENCOUNTER — Ambulatory Visit (HOSPITAL_COMMUNITY)
Admission: EM | Admit: 2022-05-28 | Discharge: 2022-05-28 | Disposition: A | Discharge status: Home / Self Care | Payer: Medicare Other | Attending: Family | Admitting: Family

## 2022-05-28 ENCOUNTER — Encounter (HOSPITAL_COMMUNITY): Payer: Self-pay | Admitting: Emergency Medicine

## 2022-05-28 ENCOUNTER — Other Ambulatory Visit: Payer: Self-pay

## 2022-05-28 DIAGNOSIS — R053 Chronic cough: Secondary | ICD-10-CM | POA: Diagnosis not present

## 2022-05-28 DIAGNOSIS — R0982 Postnasal drip: Secondary | ICD-10-CM

## 2022-05-28 DIAGNOSIS — R0981 Nasal congestion: Secondary | ICD-10-CM

## 2022-05-28 MED ORDER — MONTELUKAST SODIUM 10 MG PO TABS: 10.0000 mg | ORAL_TABLET | Freq: Every day | ORAL | 2 refills | Status: DC

## 2022-05-28 MED ORDER — FLUTICASONE PROPIONATE 50 MCG/ACT NA SUSP: 1.0000 | Freq: Two times a day (BID) | NASAL | 2 refills | Status: DC

## 2022-05-28 NOTE — Discharge Instructions (Signed)
Recommend try Singulair '10mg'$  pill- once daily in the evening to help with drainage. Also use Flonase nasal spray- use 1 spray in each nostril twice a day. May need to take an oral Reflux medication to see if this would help with the drainage but need to see an ENT. You should call your PCP for their recommendation and we have listed 2 practices [facility] and [facility] in [place]) for you to contact to make an appointment. Recommend follow-up with your PCP and ENT as planned.

## 2022-05-28 NOTE — ED Triage Notes (Signed)
Complains of cough, congestion, post nasal drip and headache-symptoms for one year.    Has used flonase, claritin d , allegra, and 2-3 other medicines.

## 2022-05-28 NOTE — ED Provider Notes (Signed)
[facility]    CSN: [id number] Arrival date & time: 05/28/22  1404      History   Chief Complaint No chief complaint on file.   HPI Brandon Weaver is a 87 y.o. male.   87 year old male presents with continued, chronic post nasal drainage, throat irritation which is causing a chronic cough for over 2 years. He complains of nasal congestion, intermittent frontal sinus headaches, post pharyngeal clear drainage and mostly non-productive cough which is causing interrupted sleep. He has tried multiple antihistamines including Claritin, Allegra, Zyrtec with Sudafed with no relief. He has also tried Astelin nasal spray. He was prescribed Flonase but does not remember if that helped. He has also been placed on oral steroids, Mucinex DM and Tessalon for cough with no relief. He does have a history of GERD and was placed on Prilosec which did help with GERD but he does not recall it helped with the cough and drainage. He has also tried Zantac with no relief. He did see an ENT, Dr. Ezzard Standing- most recent visit in June 2022 and had a laryngoscopy which showed mild arytenoid edema thought to be due to reflux. He was placed on Prilosec and Flonase but patient does not believe these medications helped. He has also undergone upper GI with no other findings.  Other chronic health issues include diabetes with chronic kidney disease- managed with Humulin and Farxiga use. Also history of HTN and hyperlipidemia- currently on Norvasc, Toprol XL and Pravachol. Other health issues include arthritis and currently uses Voltaren gel and Flexeril as needed.   The history is provided by the patient.    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    GERD (gastroesophageal reflux disease)     hx of GERD - had reflux surgery (see surgical hx) per pt 05/31/19   Headache    hx of migraine   History of kidney stones    per pt 05/31/19 "they told me to leave it alone until it gives me problems, so I still got them"    Hypertension    Pneumonia    hx of pneumonia ~10years ago 05/31/19   Sleep apnea    does not tolerate cpap    Patient Active Problem List   Diagnosis Date Noted   S/P cervical spinal fusion 06/03/2019   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/2019   OSA (obstructive sleep apnea) 03/01/2014    Past Surgical History:  Procedure Laterality Date   ANTERIOR CERVICAL DECOMP/DISCECTOMY FUSION N/A 06/03/2019   Procedure: ANTERIOR CERVICAL DECOMPRESSION FUSION CERVICAL THREE-FOUR;  Surgeon: Tia Alert, MD;  Location: [facility] OR;  Service: Neurosurgery;  Laterality: N/A;  anterior   DENTAL SURGERY     dental plates put in upper and lower ~7-8 years ago per pt 05/31/19   EYE SURGERY Left    lasik eye surgery "about 15-16 years ago" 05/31/19   L shoulder surgery     muscle repair   reflux surgery     ROBOTIC ADRENALECTOMY Right 03/16/2019   Procedure: XI ROBOTIC ADRENALECTOMY, RENAL CYST DECORTICATION AND NODE DISSECTION;  Surgeon: Sebastian Ache, MD;  Location: [facility]S;  Service: Urology;  Laterality: Right;  3 HRS   TONSILLECTOMY         Home Medications    Prior to Admission medications   Medication Sig Start Date End Date Taking? Authorizing Provider  fluticasone (FLONASE) 50 MCG/ACT nasal spray Place 1 spray into both nostrils 2 (two) times daily. 05/28/22  Yes Krystine Pabst, Ali Lowe, NP  montelukast (SINGULAIR) 10 MG tablet Take 1 tablet (10 mg total) by mouth at bedtime. 05/28/22  Yes Yessika Otte, Ali Lowe, NP  amLODipine (NORVASC) 5 MG tablet Take 5 mg by mouth daily. 12/17/18   [provider]  azelastine (ASTELIN) 0.1 % nasal spray Place 2 sprays into both nostrils 2 (two) times daily. Use in each nostril as directed Patient not taking: Reported  on 05/28/2022 12/12/21   Ellsworth Lennox, PA-C  carboxymethylcellulose (REFRESH PLUS) 0.5 % SOLN Place 1-2 drops into both eyes 3 (three) times daily as needed (dry/irritated eyes.).    [provider]  Cholecalciferol (VITAMIN D3) 1.25 MG (50000 UT) CAPS Take 1 capsule by mouth once a week. 05/16/19   [provider]  cyclobenzaprine (FLEXERIL) 10 MG tablet Take 10 mg by mouth 3 (three) times daily as needed for muscle spasms.  07/05/19   [provider]  diclofenac Sodium (VOLTAREN) 1 % GEL Apply 2 g topically 4 (four) times daily. 09/06/19   Couture, Cortni S, PA-C  FARXIGA 10 MG TABS tablet Take 10 mg by mouth daily. 06/14/21   [provider]  HUMULIN 70/30 (70-30) 100 UNIT/ML injection Inject 45 Units into the skin 2 (two) times daily. 01/24/19   [provider]  metoprolol succinate (TOPROL-XL) 25 MG 24 hr tablet Take 25 mg by mouth daily. 06/14/21   [provider]  [facility] ULTRA test strip 1 each by Other route in the morning and at bedtime. 07/15/19   [provider]  pravastatin (PRAVACHOL) 10 MG tablet Take 10 mg by mouth at bedtime. 07/08/21   [provider]    Family History Family History  Problem Relation Age of Onset   COPD Mother    Heart disease Mother    COPD Brother    Cancer - Prostate Brother    Sleep apnea Son     Social History Social History   Tobacco Use   Smoking status: Former    Packs/day: 0.50    Years: 30.00    Total pack years: 15.00    Types: Cigarettes    Quit date: 05/05/1986    Years since quitting: 36.0   Smokeless tobacco: Never  Vaping Use   Vaping Use: Never used  Substance Use Topics   Alcohol use: Not Currently   Drug use: Never     Allergies   Laxative [bisacodyl]   Review of Systems Review of Systems  Constitutional:  Positive for fatigue. Negative for activity change, appetite change, chills, diaphoresis and fever.  HENT:  Positive for congestion (nasal), postnasal  drip, sinus pressure (frontal) and sore throat (irritated and constant clearing of throat). Negative for ear discharge, ear pain, facial swelling, mouth sores, nosebleeds, sinus pain and trouble swallowing.   Eyes:  Negative for redness and itching.  Respiratory:  Positive for cough. Negative for chest tightness, shortness of breath and wheezing.   Cardiovascular:  Negative for chest pain.  Gastrointestinal:  Negative for abdominal pain, blood in stool, nausea and vomiting.  Musculoskeletal:  Positive for arthralgias and myalgias.  Skin:  Negative for color change and rash.  Allergic/Immunologic: Positive for environmental allergies. Negative for food  allergies.  Neurological:  Positive for headaches (intermittent frontal). Negative for dizziness, tremors, seizures, syncope, speech difficulty and numbness.  Hematological:  Negative for adenopathy. Bruises/bleeds easily.  Psychiatric/Behavioral:  Positive for sleep disturbance.      Physical Exam Triage Vital Signs ED Triage Vitals  Enc Vitals Group     BP 05/28/22 1507 (!) 150/76     Pulse Rate 05/28/22 1507 60     Resp 05/28/22 1507 20     Temp 05/28/22 1507 98.8 F (37.1 C)     Temp Source 05/28/22 1507 Oral     SpO2 05/28/22 1507 95 %     Weight --      Height --      Head Circumference --      Peak Flow --      Pain Score 05/28/22 1504 0     Pain Loc --      Pain Edu? --      Excl. in GC? --    No data found.  Updated Vital Signs BP (!) 150/76 (BP Location: Right Arm)   Pulse 60   Temp 98.8 F (37.1 C) (Oral)   Resp 20   SpO2 95%   Visual Acuity Right Eye Distance:   Left Eye Distance:   Bilateral Distance:    Right Eye Near:   Left Eye Near:    Bilateral Near:     Physical Exam Vitals and nursing note reviewed.  Constitutional:      General: He is awake. He is not in acute distress.    Appearance: He is well-developed and well-groomed.     Comments: Pleasant patient sitting on the exam table in no acute  distress, talking in complete sentences.   HENT:     Head: Normocephalic and atraumatic.     Right Ear: Hearing, tympanic membrane, ear canal and external ear normal.     Left Ear: Hearing, tympanic membrane, ear canal and external ear normal.     Nose: Mucosal edema present. No rhinorrhea.     Right Nostril: No epistaxis or occlusion.     Left Nostril: No epistaxis or occlusion.     Right Sinus: No maxillary sinus tenderness or frontal sinus tenderness.     Left Sinus: No maxillary sinus tenderness or frontal sinus tenderness.     Mouth/Throat:     Lips: Pink.     Mouth: Mucous membranes are moist.     Pharynx: Uvula midline. Oropharyngeal exudate present. No pharyngeal swelling, posterior oropharyngeal erythema or uvula swelling.     Comments: Clear post nasal drainage present in posterior pharynx Eyes:     Extraocular Movements: Extraocular movements intact.     Conjunctiva/sclera: Conjunctivae normal.  Cardiovascular:     Rate and Rhythm: Normal rate and regular rhythm.     Heart sounds: Normal heart sounds. No murmur heard. Pulmonary:     Effort: Pulmonary effort is normal. No tachypnea, accessory muscle usage, respiratory distress or retractions.     Breath sounds: Normal air entry. No decreased air movement. Examination of the right-upper field reveals decreased breath sounds. Examination of the left-upper field reveals decreased breath sounds. Examination of the right-middle field reveals decreased breath sounds. Examination of the right-lower field reveals decreased breath sounds. Examination of the left-lower field reveals decreased breath sounds. Decreased breath sounds present. No wheezing or rhonchi.     Comments: Quieter breath sounds in all lung fields. No distinct rales, rhonchi or wheezes present.  Musculoskeletal:  Cervical back: Neck supple.  Lymphadenopathy:     Cervical: No cervical adenopathy.  Skin:    General: Skin is warm and dry.     Capillary Refill:  Capillary refill takes less than 2 seconds.     Findings: No rash.  Neurological:     General: No focal deficit present.     Mental Status: He is alert and oriented to person, place, and time.  Psychiatric:        Attention and Perception: Attention normal.        Mood and Affect: Mood normal.        Speech: Speech normal.        Behavior: Behavior normal. Behavior is cooperative.        Thought Content: Thought content normal.     Comments: He has some difficulty recalling medications he has tried and outcome of visits to his PCP and ENT.       UC Treatments / Results  Labs (all labs ordered are listed, but only abnormal results are displayed) Labs Reviewed - No data to display  EKG   Radiology No results found.  Procedures Procedures (including critical care time)  Medications Ordered in UC Medications - No data to display  Initial Impression / Assessment and Plan / UC Course  I have reviewed the triage vital signs and the nursing notes.  Pertinent labs & imaging results that were available during my care of the patient were reviewed by me and considered in my medical decision making (see chart for details).     Reviewed with patient that he does not appear to have a bacterial infection and no antibiotics are needed. Patient appears to have chronic rhinitis with drainage causing cough but also may have some reflux contributing to cough. He needs further evaluation again by an ENT. He may have a sinus fungal infection or other unusual etiology in which he needs additional scans and work-up. Discussed that we can retry Flonase 1 spray each nostril twice a day to help with congestion since he can not recall if this medication helped in the past. He does not appear to have tried Singulair and with cough as a main component, I feel it is worthwhile to trial Singulair 10mg  once daily. Tried to discuss how GI reflux can contribute to throat symptoms and cough but patient did not  appear to understand connection so did not restart PPI or H2 blocker at this time. Since uncertain if Dr. Ezzard Standing, ENT, is still in practice in [place] (can not find current information), recommend patient call his PCP for recommendations. Also listed 2 neaby ENT offices [facility] or [facility]) for patient to contact to schedule appointment ASAP.  Final Clinical Impressions(s) / UC Diagnoses   Final diagnoses:  Post-nasal drainage  Chronic nasal congestion  Chronic cough     Discharge Instructions      Recommend try Singulair 10mg  pill- once daily in the evening to help with drainage. Also use Flonase nasal spray- use 1 spray in each nostril twice a day. May need to take an oral Reflux medication to see if this would help with the drainage but need to see an ENT. You should call your PCP for their recommendation and we have listed 2 practices [facility] and [facility] in [place]) for you to contact to make an appointment. Recommend follow-up with your PCP and ENT as planned.      ED Prescriptions     Medication  Sig Dispense Auth. Provider   montelukast (SINGULAIR) 10 MG tablet Take 1 tablet (10 mg total) by mouth at bedtime. 30 tablet Sudie Grumbling, NP   fluticasone (FLONASE) 50 MCG/ACT nasal spray Place 1 spray into both nostrils 2 (two) times daily. 16 g Sudie Grumbling, NP      PDMP not reviewed this encounter.   Sudie Grumbling, NP 05/29/22 [phone]

## 2022-06-09 DIAGNOSIS — N184 Chronic kidney disease, stage 4 (severe): Secondary | ICD-10-CM | POA: Diagnosis not present

## 2022-06-19 DIAGNOSIS — J3489 Other specified disorders of nose and nasal sinuses: Secondary | ICD-10-CM | POA: Diagnosis not present

## 2022-06-19 DIAGNOSIS — R0982 Postnasal drip: Secondary | ICD-10-CM | POA: Diagnosis not present

## 2022-06-20 DIAGNOSIS — I129 Hypertensive chronic kidney disease with stage 1 through stage 4 chronic kidney disease, or unspecified chronic kidney disease: Secondary | ICD-10-CM | POA: Diagnosis not present

## 2022-06-20 DIAGNOSIS — N184 Chronic kidney disease, stage 4 (severe): Secondary | ICD-10-CM | POA: Diagnosis not present

## 2022-06-20 DIAGNOSIS — R801 Persistent proteinuria, unspecified: Secondary | ICD-10-CM | POA: Diagnosis not present

## 2022-06-20 DIAGNOSIS — E1122 Type 2 diabetes mellitus with diabetic chronic kidney disease: Secondary | ICD-10-CM | POA: Diagnosis not present

## 2022-06-20 DIAGNOSIS — E559 Vitamin D deficiency, unspecified: Secondary | ICD-10-CM | POA: Diagnosis not present

## 2022-06-20 DIAGNOSIS — Z87442 Personal history of urinary calculi: Secondary | ICD-10-CM | POA: Diagnosis not present

## 2022-06-23 DIAGNOSIS — J341 Cyst and mucocele of nose and nasal sinus: Secondary | ICD-10-CM | POA: Diagnosis not present

## 2022-06-23 DIAGNOSIS — R0982 Postnasal drip: Secondary | ICD-10-CM | POA: Diagnosis not present

## 2022-06-23 DIAGNOSIS — J342 Deviated nasal septum: Secondary | ICD-10-CM | POA: Diagnosis not present

## 2022-07-11 DIAGNOSIS — E119 Type 2 diabetes mellitus without complications: Secondary | ICD-10-CM | POA: Diagnosis not present

## 2022-07-11 DIAGNOSIS — H25813 Combined forms of age-related cataract, bilateral: Secondary | ICD-10-CM | POA: Diagnosis not present

## 2022-07-11 DIAGNOSIS — H40033 Anatomical narrow angle, bilateral: Secondary | ICD-10-CM | POA: Diagnosis not present

## 2022-07-11 DIAGNOSIS — H40023 Open angle with borderline findings, high risk, bilateral: Secondary | ICD-10-CM | POA: Diagnosis not present

## 2022-07-11 DIAGNOSIS — H524 Presbyopia: Secondary | ICD-10-CM | POA: Diagnosis not present

## 2022-07-17 IMAGING — DX DG CHEST 2V
2 series · 2 of 2 positions shown · non-contrast
Comparison: May 17, 2020

CLINICAL DATA: Cough and shortness of breath.

EXAM:
CHEST - 2 VIEW

[chest pa]
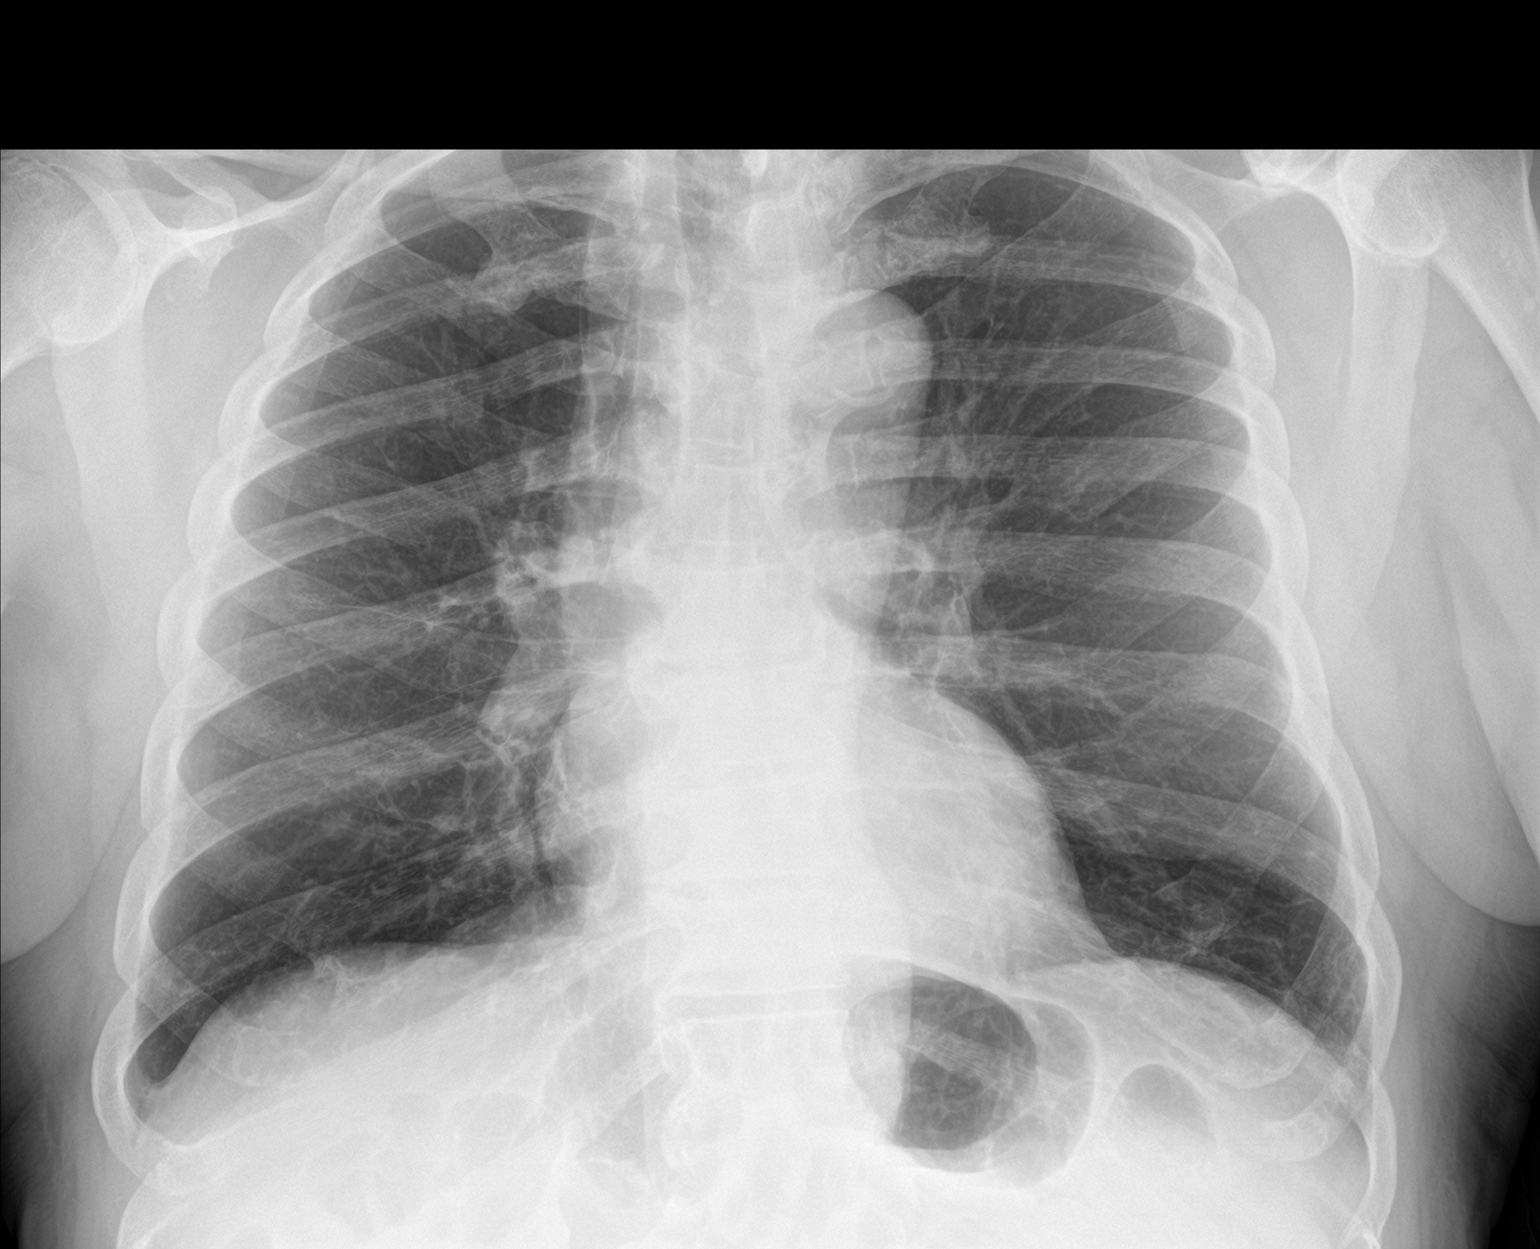

[chest lat]
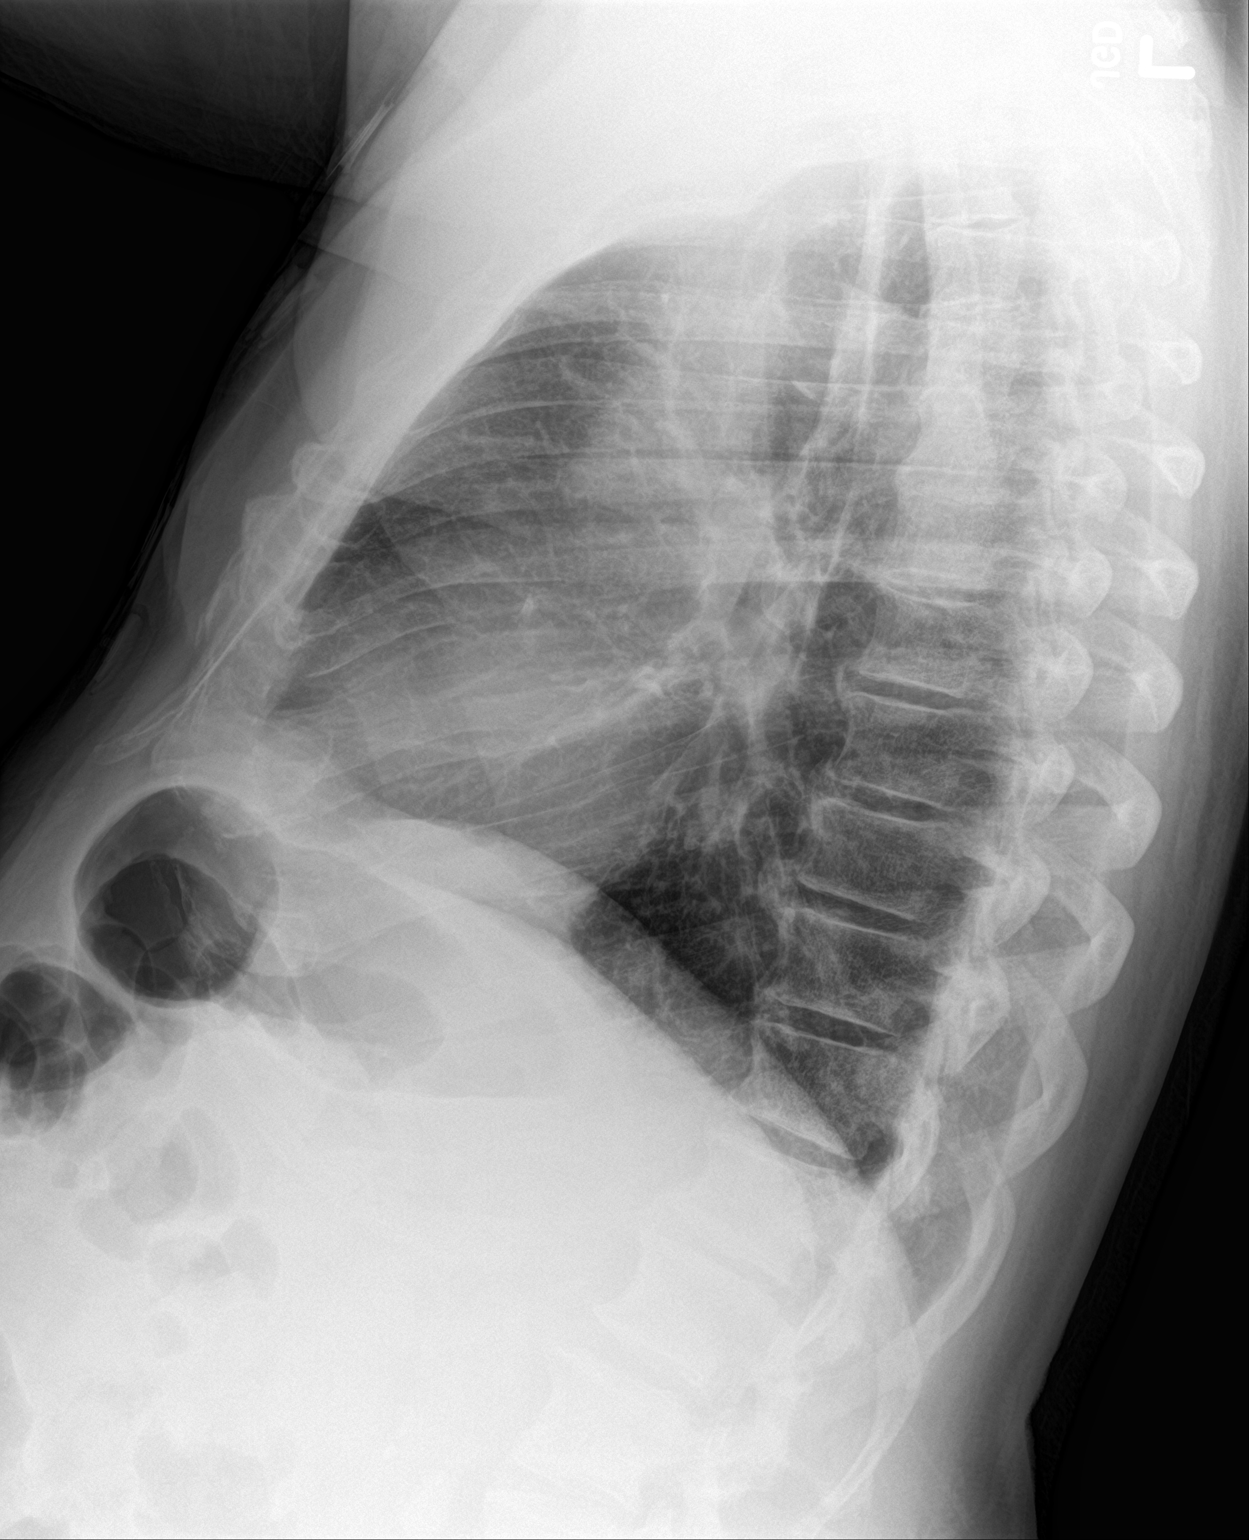

[2 of 2 positions shown; findings below may reference images not displayed]

FINDINGS: Tortuosity and calcific atherosclerotic disease of the aorta.

Cardiomediastinal silhouette is normal. Mediastinal contours appear
intact.

There is no evidence of focal airspace consolidation, pleural
effusion or pneumothorax.

Osseous structures are without acute abnormality. Soft tissues are
grossly normal.
IMPRESSION: 1. No active cardiopulmonary disease.
2. Tortuosity and calcific atherosclerotic disease of the aorta.

## 2022-07-28 DIAGNOSIS — K5900 Constipation, unspecified: Secondary | ICD-10-CM | POA: Diagnosis not present

## 2022-07-28 DIAGNOSIS — J302 Other seasonal allergic rhinitis: Secondary | ICD-10-CM | POA: Diagnosis not present

## 2022-08-18 DIAGNOSIS — E7800 Pure hypercholesterolemia, unspecified: Secondary | ICD-10-CM | POA: Diagnosis not present

## 2022-08-18 DIAGNOSIS — I10 Essential (primary) hypertension: Secondary | ICD-10-CM | POA: Diagnosis not present

## 2022-08-18 DIAGNOSIS — N184 Chronic kidney disease, stage 4 (severe): Secondary | ICD-10-CM | POA: Diagnosis not present

## 2022-08-18 DIAGNOSIS — R809 Proteinuria, unspecified: Secondary | ICD-10-CM | POA: Diagnosis not present

## 2022-08-18 DIAGNOSIS — J309 Allergic rhinitis, unspecified: Secondary | ICD-10-CM | POA: Diagnosis not present

## 2022-08-18 DIAGNOSIS — I700 Atherosclerosis of aorta: Secondary | ICD-10-CM | POA: Diagnosis not present

## 2022-08-18 DIAGNOSIS — E1122 Type 2 diabetes mellitus with diabetic chronic kidney disease: Secondary | ICD-10-CM | POA: Diagnosis not present

## 2022-08-18 DIAGNOSIS — K5904 Chronic idiopathic constipation: Secondary | ICD-10-CM | POA: Diagnosis not present

## 2022-08-18 DIAGNOSIS — G4733 Obstructive sleep apnea (adult) (pediatric): Secondary | ICD-10-CM | POA: Diagnosis not present

## 2022-08-18 DIAGNOSIS — I129 Hypertensive chronic kidney disease with stage 1 through stage 4 chronic kidney disease, or unspecified chronic kidney disease: Secondary | ICD-10-CM | POA: Diagnosis not present

## 2022-08-18 DIAGNOSIS — K219 Gastro-esophageal reflux disease without esophagitis: Secondary | ICD-10-CM | POA: Diagnosis not present

## 2022-09-18 DIAGNOSIS — Z87442 Personal history of urinary calculi: Secondary | ICD-10-CM | POA: Diagnosis not present

## 2022-09-18 DIAGNOSIS — E1122 Type 2 diabetes mellitus with diabetic chronic kidney disease: Secondary | ICD-10-CM | POA: Diagnosis not present

## 2022-09-18 DIAGNOSIS — N184 Chronic kidney disease, stage 4 (severe): Secondary | ICD-10-CM | POA: Diagnosis not present

## 2022-09-18 DIAGNOSIS — E559 Vitamin D deficiency, unspecified: Secondary | ICD-10-CM | POA: Diagnosis not present

## 2022-09-18 DIAGNOSIS — I129 Hypertensive chronic kidney disease with stage 1 through stage 4 chronic kidney disease, or unspecified chronic kidney disease: Secondary | ICD-10-CM | POA: Diagnosis not present

## 2022-09-18 DIAGNOSIS — R801 Persistent proteinuria, unspecified: Secondary | ICD-10-CM | POA: Diagnosis not present

## 2022-12-17 DIAGNOSIS — N184 Chronic kidney disease, stage 4 (severe): Secondary | ICD-10-CM | POA: Diagnosis not present

## 2022-12-17 DIAGNOSIS — E7800 Pure hypercholesterolemia, unspecified: Secondary | ICD-10-CM | POA: Diagnosis not present

## 2022-12-17 DIAGNOSIS — R809 Proteinuria, unspecified: Secondary | ICD-10-CM | POA: Diagnosis not present

## 2022-12-17 DIAGNOSIS — I10 Essential (primary) hypertension: Secondary | ICD-10-CM | POA: Diagnosis not present

## 2022-12-17 DIAGNOSIS — I700 Atherosclerosis of aorta: Secondary | ICD-10-CM | POA: Diagnosis not present

## 2022-12-17 DIAGNOSIS — Z0000 Encounter for general adult medical examination without abnormal findings: Secondary | ICD-10-CM | POA: Diagnosis not present

## 2022-12-17 DIAGNOSIS — E1122 Type 2 diabetes mellitus with diabetic chronic kidney disease: Secondary | ICD-10-CM | POA: Diagnosis not present

## 2022-12-17 DIAGNOSIS — Z23 Encounter for immunization: Secondary | ICD-10-CM | POA: Diagnosis not present

## 2022-12-19 DIAGNOSIS — Z87442 Personal history of urinary calculi: Secondary | ICD-10-CM | POA: Diagnosis not present

## 2022-12-19 DIAGNOSIS — N184 Chronic kidney disease, stage 4 (severe): Secondary | ICD-10-CM | POA: Diagnosis not present

## 2022-12-19 DIAGNOSIS — R801 Persistent proteinuria, unspecified: Secondary | ICD-10-CM | POA: Diagnosis not present

## 2022-12-19 DIAGNOSIS — I129 Hypertensive chronic kidney disease with stage 1 through stage 4 chronic kidney disease, or unspecified chronic kidney disease: Secondary | ICD-10-CM | POA: Diagnosis not present

## 2022-12-19 DIAGNOSIS — E559 Vitamin D deficiency, unspecified: Secondary | ICD-10-CM | POA: Diagnosis not present

## 2022-12-19 DIAGNOSIS — E1122 Type 2 diabetes mellitus with diabetic chronic kidney disease: Secondary | ICD-10-CM | POA: Diagnosis not present

## 2022-12-23 DIAGNOSIS — K5900 Constipation, unspecified: Secondary | ICD-10-CM | POA: Diagnosis not present

## 2022-12-23 DIAGNOSIS — E1122 Type 2 diabetes mellitus with diabetic chronic kidney disease: Secondary | ICD-10-CM | POA: Diagnosis not present

## 2022-12-30 ENCOUNTER — Other Ambulatory Visit: Payer: Self-pay

## 2022-12-30 ENCOUNTER — Emergency Department (HOSPITAL_COMMUNITY)
Admission: EM | Admit: 2022-12-30 | Discharge: 2022-12-30 | Disposition: A | Discharge status: Home / Self Care | Payer: Medicare Other | Attending: Emergency Medicine | Admitting: Emergency Medicine

## 2022-12-30 ENCOUNTER — Encounter (HOSPITAL_COMMUNITY): Payer: Self-pay

## 2022-12-30 ENCOUNTER — Emergency Department (HOSPITAL_COMMUNITY): Payer: Medicare Other

## 2022-12-30 DIAGNOSIS — N189 Chronic kidney disease, unspecified: Secondary | ICD-10-CM | POA: Diagnosis not present

## 2022-12-30 DIAGNOSIS — K6289 Other specified diseases of anus and rectum: Secondary | ICD-10-CM | POA: Diagnosis not present

## 2022-12-30 DIAGNOSIS — K5900 Constipation, unspecified: Secondary | ICD-10-CM | POA: Diagnosis not present

## 2022-12-30 DIAGNOSIS — I10 Essential (primary) hypertension: Secondary | ICD-10-CM | POA: Diagnosis not present

## 2022-12-30 DIAGNOSIS — Z794 Long term (current) use of insulin: Secondary | ICD-10-CM | POA: Insufficient documentation

## 2022-12-30 DIAGNOSIS — Z79899 Other long term (current) drug therapy: Secondary | ICD-10-CM | POA: Diagnosis not present

## 2022-12-30 DIAGNOSIS — N200 Calculus of kidney: Secondary | ICD-10-CM | POA: Diagnosis not present

## 2022-12-30 DIAGNOSIS — Q438 Other specified congenital malformations of intestine: Secondary | ICD-10-CM | POA: Diagnosis not present

## 2022-12-30 DIAGNOSIS — I129 Hypertensive chronic kidney disease with stage 1 through stage 4 chronic kidney disease, or unspecified chronic kidney disease: Secondary | ICD-10-CM | POA: Insufficient documentation

## 2022-12-30 DIAGNOSIS — E1122 Type 2 diabetes mellitus with diabetic chronic kidney disease: Secondary | ICD-10-CM | POA: Insufficient documentation

## 2022-12-30 DIAGNOSIS — N281 Cyst of kidney, acquired: Secondary | ICD-10-CM | POA: Diagnosis not present

## 2022-12-30 DIAGNOSIS — K8020 Calculus of gallbladder without cholecystitis without obstruction: Secondary | ICD-10-CM | POA: Diagnosis not present

## 2022-12-30 LAB — CBC WITH DIFFERENTIAL/PLATELET
Abs Immature Granulocytes: 0.02 10*3/uL (ref 0.00–0.07)
Basophils Absolute: 0 10*3/uL (ref 0.0–0.1)
Basophils Relative: 1 %
Eosinophils Absolute: 0.1 10*3/uL (ref 0.0–0.5)
Eosinophils Relative: 3 %
HCT: 40.7 % (ref 39.0–52.0)
Hemoglobin: 13 g/dL (ref 13.0–17.0)
Immature Granulocytes: 0 %
Lymphocytes Relative: 27 %
Lymphs Abs: 1.2 10*3/uL (ref 0.7–4.0)
MCH: 28.1 pg (ref 26.0–34.0)
MCHC: 31.9 g/dL (ref 30.0–36.0)
MCV: 87.9 fL (ref 80.0–100.0)
Monocytes Absolute: 0.8 10*3/uL (ref 0.1–1.0)
Monocytes Relative: 18 %
Neutro Abs: 2.3 10*3/uL (ref 1.7–7.7)
Neutrophils Relative %: 51 %
Platelets: 214 10*3/uL (ref 150–400)
RBC: 4.63 MIL/uL (ref 4.22–5.81)
RDW: 13.7 % (ref 11.5–15.5)
WBC: 4.5 10*3/uL (ref 4.0–10.5)
nRBC: 0 % (ref 0.0–0.2)

## 2022-12-30 LAB — I-STAT CHEM 8, ED
BUN: 28 mg/dL — ABNORMAL HIGH (ref 8–23)
Calcium, Ion: 1.22 mmol/L (ref 1.15–1.40)
Chloride: 106 mmol/L (ref 98–111)
Creatinine, Ser: 2.5 mg/dL — ABNORMAL HIGH (ref 0.61–1.24)
Glucose, Bld: 103 mg/dL — ABNORMAL HIGH (ref 70–99)
HCT: 42 % (ref 39.0–52.0)
Hemoglobin: 14.3 g/dL (ref 13.0–17.0)
Potassium: 4 mmol/L (ref 3.5–5.1)
Sodium: 138 mmol/L (ref 135–145)
TCO2: 20 mmol/L — ABNORMAL LOW (ref 22–32)

## 2022-12-30 LAB — COMPREHENSIVE METABOLIC PANEL
ALT: 19 U/L (ref 0–44)
AST: 20 U/L (ref 15–41)
Albumin: 3.6 g/dL (ref 3.5–5.0)
Alkaline Phosphatase: 54 U/L (ref 38–126)
Anion gap: 9 (ref 5–15)
BUN: 28 mg/dL — ABNORMAL HIGH (ref 8–23)
CO2: 20 mmol/L — ABNORMAL LOW (ref 22–32)
Calcium: 9.1 mg/dL (ref 8.9–10.3)
Chloride: 103 mmol/L (ref 98–111)
Creatinine, Ser: 2.48 mg/dL — ABNORMAL HIGH (ref 0.61–1.24)
GFR, Estimated: 25 mL/min — ABNORMAL LOW (ref >60)
Glucose, Bld: 106 mg/dL — ABNORMAL HIGH (ref 70–99)
Potassium: 3.8 mmol/L (ref 3.5–5.1)
Sodium: 132 mmol/L — ABNORMAL LOW (ref 135–145)
Total Bilirubin: 1 mg/dL (ref 0.3–1.2)
Total Protein: 6.8 g/dL (ref 6.5–8.1)

## 2022-12-30 LAB — MAGNESIUM: Magnesium: 2.2 mg/dL (ref 1.7–2.4)

## 2022-12-30 LAB — LIPASE, BLOOD: Lipase: 53 U/L — ABNORMAL HIGH (ref 11–51)

## 2022-12-30 MED ORDER — LACTATED RINGERS IV BOLUS
500.0000 mL | Freq: Once | INTRAVENOUS | Status: AC
  Administered 2022-12-30: 500 mL via INTRAVENOUS

## 2022-12-30 NOTE — ED Provider Notes (Signed)
[place] EMERGENCY DEPARTMENT AT [facility] Provider Note   CSN: [id number] Arrival date & time: 12/30/22  4098     History  Chief Complaint  Patient presents with   Constipation    Brandon Weaver is a 87 y.o. male.  HPI Patient presents for constipation.  Medical history includes CKD, DM, GERD, nephrolithiasis, HTN, sleep apnea, arthritis.  Typically, Brandon Weaver will have a bowel movement every other day.  Brandon Weaver has not had a good sized bowel movement for the past 5 days.  Brandon Weaver attempted an enema at home.  Brandon Weaver passed 2 small pellets earlier this morning.  Brandon Weaver describes a mild discomfort in his rectal area.  Brandon Weaver denies abdominal pain.  Brandon Weaver has not had nausea but has been eating less food because of his constipation.    Home Medications Prior to Admission medications   Medication Sig Start Date End Date Taking? Authorizing Provider  amLODipine (NORVASC) 5 MG tablet Take 5 mg by mouth daily. 12/17/18   [provider]  azelastine (ASTELIN) 0.1 % nasal spray Place 2 sprays into both nostrils 2 (two) times daily. Use in each nostril as directed Patient not taking: Reported on 05/28/2022 12/12/21   Ellsworth Lennox, PA-C  carboxymethylcellulose (REFRESH PLUS) 0.5 % SOLN Place 1-2 drops into both eyes 3 (three) times daily as needed (dry/irritated eyes.).    [provider]  Cholecalciferol (VITAMIN D3) 1.25 MG (50000 UT) CAPS Take 1 capsule by mouth once a week. 05/16/19   [provider]  cyclobenzaprine (FLEXERIL) 10 MG tablet Take 10 mg by mouth 3 (three) times daily as needed for muscle spasms.  07/05/19   [provider]  diclofenac Sodium (VOLTAREN) 1 % GEL Apply 2 g topically 4 (four) times daily. 09/06/19   Couture, Cortni S, PA-C  FARXIGA 10 MG TABS tablet Take 10 mg by mouth daily. 06/14/21   [provider]  fluticasone (FLONASE) 50 MCG/ACT nasal spray Place 1 spray into both nostrils 2 (two) times daily. 05/28/22   Sudie Grumbling, NP  HUMULIN  70/30 (70-30) 100 UNIT/ML injection Inject 45 Units into the skin 2 (two) times daily. 01/24/19   [provider]  metoprolol succinate (TOPROL-XL) 25 MG 24 hr tablet Take 25 mg by mouth daily. 06/14/21   [provider]  montelukast (SINGULAIR) 10 MG tablet Take 1 tablet (10 mg total) by mouth at bedtime. 05/28/22   Sudie Grumbling, NP  [facility] ULTRA test strip 1 each by Other route in the morning and at bedtime. 07/15/19   [provider]  pravastatin (PRAVACHOL) 10 MG tablet Take 10 mg by mouth at bedtime. 07/08/21   [provider]      Allergies    Laxative [bisacodyl]    Review of Systems   Review of Systems  Gastrointestinal:  Positive for constipation and rectal pain.  All other systems reviewed and are negative.   Physical Exam Updated Vital Signs BP 129/61   Pulse 72   Temp 98.6 F (37 C) (Oral)   Resp 20   Ht 5\' 10"  (1.778 m)   Wt 89.8 kg   SpO2 100%   BMI 28.41 kg/m  Physical Exam Vitals and nursing note reviewed.  Constitutional:      General: Brandon Weaver is not in acute distress.    Appearance: Normal appearance. Brandon Weaver is well-developed. Brandon Weaver is not ill-appearing, toxic-appearing or diaphoretic.  HENT:     Head: Normocephalic and atraumatic.     Right Ear:  External ear normal.     Left Ear: External ear normal.     Nose: Nose normal.     Mouth/Throat:     Mouth: Mucous membranes are moist.  Eyes:     Extraocular Movements: Extraocular movements intact.     Conjunctiva/sclera: Conjunctivae normal.  Cardiovascular:     Rate and Rhythm: Normal rate and regular rhythm.  Pulmonary:     Effort: Pulmonary effort is normal. No respiratory distress.  Abdominal:     General: There is no distension.     Palpations: Abdomen is soft.     Tenderness: There is no abdominal tenderness.  Musculoskeletal:        General: No swelling. Normal range of motion.     Cervical back: Normal range of motion.  Skin:    General: Skin is warm and dry.      Coloration: Skin is not jaundiced or pale.  Neurological:     General: No focal deficit present.     Mental Status: Brandon Weaver is alert and oriented to person, place, and time.     Gait: Gait normal.  Psychiatric:        Mood and Affect: Mood normal.        Behavior: Behavior normal.        Thought Content: Thought content normal.        Judgment: Judgment normal.     ED Results / Procedures / Treatments   Labs (all labs ordered are listed, but only abnormal results are displayed) Labs Reviewed  COMPREHENSIVE METABOLIC PANEL - Abnormal; Notable for the following components:      Result Value   Sodium 132 (*)    CO2 20 (*)    Glucose, Bld 106 (*)    BUN 28 (*)    Creatinine, Ser 2.48 (*)    GFR, Estimated 25 (*)    All other components within normal limits  LIPASE, BLOOD - Abnormal; Notable for the following components:   Lipase 53 (*)    All other components within normal limits  I-STAT CHEM 8, ED - Abnormal; Notable for the following components:   BUN 28 (*)    Creatinine, Ser 2.50 (*)    Glucose, Bld 103 (*)    TCO2 20 (*)    All other components within normal limits  CBC WITH DIFFERENTIAL/PLATELET  MAGNESIUM  URINALYSIS, ROUTINE W REFLEX MICROSCOPIC    EKG EKG Interpretation Date/Time:  Tuesday December 30 2022 10:35:39 EDT Ventricular Rate:  80 PR Interval:  142 QRS Duration:  139 QT Interval:  431 QTC Calculation: 498 R Axis:   -70  Text Interpretation: Sinus rhythm  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 Suspected bowel obstruction. EXAM: CT ABDOMEN AND PELVIS WITHOUT CONTRAST TECHNIQUE: Multidetector CT imaging of the abdomen and pelvis was performed following the standard protocol without IV contrast. RADIATION DOSE REDUCTION: This exam was performed according to the  departmental dose-optimization program which includes automated exposure control, adjustment of the mA and/or kV according to patient size and/or use of iterative reconstruction technique. COMPARISON:  CT scan renal stone protocol from 09/06/2019. FINDINGS: Lower chest: There are moderate centrilobular emphysematous changes in the visualized bilateral lungs. There are associated mild peripheral/subpleural reticulations and bronchiectasis. No honeycombing. Normal cardiac size. No pericardial effusion. Hepatobiliary: The liver  is normal in size. Non-cirrhotic configuration. No suspicious mass. No intrahepatic or extrahepatic bile duct dilation. There is a single peripherally rim calcified gallstone without imaging signs of acute cholecystitis. Normal gallbladder wall thickness. No pericholecystic inflammatory changes. Pancreas: Unremarkable. No pancreatic ductal dilatation or surrounding inflammatory changes. Spleen: Within normal limits. No focal lesion. Adrenals/Urinary Tract: Surgically absent right adrenal gland. Unremarkable left adrenal gland. There are several simple cysts in bilateral kidneys with largest in the left kidney interpolar region anteriorly measuring up to 3.0 x 3.8 cm. There is a 1.2 x 1.3 x 1.7 cm nonobstructing calculus in the right kidney lower pole calyx. No other nephroureterolithiasis on either side. Urinary bladder is under distended, precluding optimal assessment. However, no large mass or stones identified. No perivesical fat stranding. Stomach/Bowel: No disproportionate dilation of the small or large bowel loops. Note is again made of redundant sigmoid colon. There is low-attenuation peripheral probable fluid and central small amount of frothy fecal content within the sigmoid colon. No pericolonic fat stranding. In this exam without oral and intravenous contrast, it is difficult to differentiate the sigmoid colon wall from intraluminal content however, there is probable no abnormal wall  thickening. Overall there is small to moderate stool burden. The appendix is unremarkable. Vascular/Lymphatic: No ascites or pneumoperitoneum. No abdominal or pelvic lymphadenopathy, by size criteria. No aneurysmal dilation of the major abdominal arteries. There are moderate peripheral atherosclerotic vascular calcifications of the aorta and its major branches. Reproductive: Enlarged prostate. Symmetric seminal vesicles. Other: There are bilateral small fat containing inguinal hernias. The soft tissues and abdominal wall are otherwise unremarkable. Musculoskeletal: No suspicious osseous lesions. There are mild multilevel degenerative changes in the visualized spine. IMPRESSION: 1. No evidence of bowel obstruction. Redundant sigmoid colon with low-attenuation peripheral fluid and central frothy fecal content. No pericolonic fat stranding. Overall there is small to moderate stool burden. No imaging signs of colitis. 2. Nonobstructing right renal calculus. 3. Cholelithiasis without imaging signs of acute cholecystitis. 4. Pulmonary emphysema. Electronically Signed   By: Jules Schick M.D.   On: 12/30/2022 11:24    Procedures Procedures    Medications Ordered in ED Medications  lactated ringers bolus 500 mL (0 mLs Intravenous Stopped 12/30/22 0933)    ED Course/ Medical Decision Making/ A&P                                 Medical Decision Making Amount and/or Complexity of Data Reviewed Labs: ordered. Radiology: ordered.   This patient presents to the ED for concern of constipation, this involves an extensive number of treatment options, and is a complaint that carries with it a high risk of complications and morbidity.  The differential diagnosis includes constipation, bowel obstruction, neoplasm, hemorrhoids   Co morbidities that complicate the patient evaluation  CKD, DM, GERD, nephrolithiasis, HTN, sleep apnea, arthritis   Additional history obtained:  Additional history obtained from  patient's wife External records from outside source obtained and reviewed including EMR   Lab Tests:  I Ordered, and personally interpreted labs.  The pertinent results include: Baseline CKD, normal electrolytes, normal hemoglobin, no leukocytosis   Imaging Studies ordered:  I ordered imaging studies including CT of abdomen and pelvis I independently visualized and interpreted imaging which showed no acute findings I agree with the radiologist interpretation   Cardiac Monitoring: / EKG:  The patient was maintained on a cardiac monitor.  I personally viewed and interpreted the cardiac  monitored which showed an underlying rhythm of: Sinus rhythm with PACs in bigeminy pattern   Problem List / ED Course / Critical interventions / Medication management  Patient presents for constipation.  Last bowel movement was 5 days ago.  Vital signs on arrival are normal.  Patient is well-appearing on exam.  Brandon Weaver denies any abdominal discomfort.  Abdomen does not appear distended and is nontender.  Brandon Weaver does describe some mild rectal pain.  DRE was performed with nurse present.  No stool was present in rectal vault.  Soapsuds enema was given.  Following enema, patient had 2 large bowel movements.  Brandon Weaver underwent CT of abdomen and pelvis which did not show acute findings.  Brandon Weaver was discharged in good condition. I ordered medication including IV fluids for hydration; soapsuds enema for constipation Reevaluation of the patient after these medicines showed that the patient improved I have reviewed the patients home medicines and have made adjustments as needed   Social Determinants of Health:  Has PCP         Final Clinical Impression(s) / ED Diagnoses Final diagnoses:  Constipation, unspecified constipation type    Rx / DC Orders ED Discharge Orders     None         Gloris Manchester, MD 12/30/22 1301

## 2022-12-30 NOTE — Discharge Instructions (Signed)
Take daily MiraLAX and Metamucil to maintain daily soft stools.  Stay hydrated.  Follow-up with your PCP.  Return to emergency department for any new or worsening symptoms of concern.

## 2022-12-30 NOTE — ED Notes (Signed)
Soap sud enema completed, pt tolerated well. Wife present at bedside.

## 2022-12-30 NOTE — ED Triage Notes (Signed)
Pt states he has not had a BM in 5 days, no nausea, still passing gas.

## 2022-12-31 DIAGNOSIS — K5900 Constipation, unspecified: Secondary | ICD-10-CM | POA: Diagnosis not present

## 2023-01-02 ENCOUNTER — Other Ambulatory Visit: Payer: Self-pay

## 2023-01-02 ENCOUNTER — Emergency Department (HOSPITAL_COMMUNITY): Payer: Medicare Other

## 2023-01-02 ENCOUNTER — Emergency Department (HOSPITAL_COMMUNITY)
Admission: EM | Admit: 2023-01-02 | Discharge: 2023-01-02 | Disposition: A | Discharge status: Home / Self Care | Payer: Medicare Other

## 2023-01-02 DIAGNOSIS — R109 Unspecified abdominal pain: Secondary | ICD-10-CM | POA: Diagnosis not present

## 2023-01-02 DIAGNOSIS — Z79899 Other long term (current) drug therapy: Secondary | ICD-10-CM | POA: Diagnosis not present

## 2023-01-02 DIAGNOSIS — K5900 Constipation, unspecified: Secondary | ICD-10-CM | POA: Diagnosis not present

## 2023-01-02 DIAGNOSIS — E119 Type 2 diabetes mellitus without complications: Secondary | ICD-10-CM | POA: Diagnosis not present

## 2023-01-02 DIAGNOSIS — Z794 Long term (current) use of insulin: Secondary | ICD-10-CM | POA: Insufficient documentation

## 2023-01-02 DIAGNOSIS — I10 Essential (primary) hypertension: Secondary | ICD-10-CM | POA: Insufficient documentation

## 2023-01-02 DIAGNOSIS — R630 Anorexia: Secondary | ICD-10-CM | POA: Insufficient documentation

## 2023-01-02 DIAGNOSIS — R110 Nausea: Secondary | ICD-10-CM | POA: Insufficient documentation

## 2023-01-02 DIAGNOSIS — Q438 Other specified congenital malformations of intestine: Secondary | ICD-10-CM | POA: Diagnosis not present

## 2023-01-02 DIAGNOSIS — K8020 Calculus of gallbladder without cholecystitis without obstruction: Secondary | ICD-10-CM | POA: Diagnosis not present

## 2023-01-02 DIAGNOSIS — N200 Calculus of kidney: Secondary | ICD-10-CM | POA: Diagnosis not present

## 2023-01-02 LAB — COMPREHENSIVE METABOLIC PANEL
ALT: 19 U/L (ref 0–44)
AST: 21 U/L (ref 15–41)
Albumin: 3.5 g/dL (ref 3.5–5.0)
Alkaline Phosphatase: 57 U/L (ref 38–126)
Anion gap: 12 (ref 5–15)
BUN: 30 mg/dL — ABNORMAL HIGH (ref 8–23)
CO2: 20 mmol/L — ABNORMAL LOW (ref 22–32)
Calcium: 9.4 mg/dL (ref 8.9–10.3)
Chloride: 103 mmol/L (ref 98–111)
Creatinine, Ser: 2.52 mg/dL — ABNORMAL HIGH (ref 0.61–1.24)
GFR, Estimated: 24 mL/min — ABNORMAL LOW (ref >60)
Glucose, Bld: 102 mg/dL — ABNORMAL HIGH (ref 70–99)
Potassium: 4.3 mmol/L (ref 3.5–5.1)
Sodium: 135 mmol/L (ref 135–145)
Total Bilirubin: 1 mg/dL (ref 0.3–1.2)
Total Protein: 6.6 g/dL (ref 6.5–8.1)

## 2023-01-02 LAB — CBC WITH DIFFERENTIAL/PLATELET
Abs Immature Granulocytes: 0.01 10*3/uL (ref 0.00–0.07)
Basophils Absolute: 0 10*3/uL (ref 0.0–0.1)
Basophils Relative: 1 %
Eosinophils Absolute: 0.2 10*3/uL (ref 0.0–0.5)
Eosinophils Relative: 4 %
HCT: 39.7 % (ref 39.0–52.0)
Hemoglobin: 12.9 g/dL — ABNORMAL LOW (ref 13.0–17.0)
Immature Granulocytes: 0 %
Lymphocytes Relative: 25 %
Lymphs Abs: 1.1 10*3/uL (ref 0.7–4.0)
MCH: 29 pg (ref 26.0–34.0)
MCHC: 32.5 g/dL (ref 30.0–36.0)
MCV: 89.2 fL (ref 80.0–100.0)
Monocytes Absolute: 0.6 10*3/uL (ref 0.1–1.0)
Monocytes Relative: 13 %
Neutro Abs: 2.4 10*3/uL (ref 1.7–7.7)
Neutrophils Relative %: 57 %
Platelets: 196 10*3/uL (ref 150–400)
RBC: 4.45 MIL/uL (ref 4.22–5.81)
RDW: 13.4 % (ref 11.5–15.5)
WBC: 4.2 10*3/uL (ref 4.0–10.5)
nRBC: 0 % (ref 0.0–0.2)

## 2023-01-02 LAB — LIPASE, BLOOD: Lipase: 52 U/L — ABNORMAL HIGH (ref 11–51)

## 2023-01-02 NOTE — ED Provider Notes (Signed)
[place] EMERGENCY DEPARTMENT AT [facility] Provider Note   CSN: [id number] Arrival date & time: 01/02/23  0800     History  Chief Complaint  Patient presents with   Constipation    Brandon Weaver is a 87 y.o. male.   Constipation 87 year old male with past medical history of diabetes and hypertension presenting to the emergency department today with abdominal discomfort and constipation.  The patient was seen here on the 27th and had a noncontrast CT scan which did not show any signs of obstruction.  The patient was told to take MiraLAX.  He states that he has been taking this as prescribed still has not had a bowel movement.  He states that he has had decreased appetite since then.  His wife reports that he has been having intermittent abdominal pain during that time that does seem to be getting a little worse.  The patient has been afebrile.  He is still passing flatus.  He has been straining a lot to have bowel movements but states the only thing he is able to produce is some gas.  He came back to the emergency department today due to worsening symptoms.     Home Medications Prior to Admission medications   Medication Sig Start Date End Date Taking? Authorizing Provider  amLODipine (NORVASC) 5 MG tablet Take 5 mg by mouth daily. 12/17/18   [provider]  azelastine (ASTELIN) 0.1 % nasal spray Place 2 sprays into both nostrils 2 (two) times daily. Use in each nostril as directed Patient not taking: Reported on 05/28/2022 12/12/21   Ellsworth Lennox, PA-C  carboxymethylcellulose (REFRESH PLUS) 0.5 % SOLN Place 1-2 drops into both eyes 3 (three) times daily as needed (dry/irritated eyes.).    [provider]  Cholecalciferol (VITAMIN D3) 1.25 MG (50000 UT) CAPS Take 1 capsule by mouth once a week. 05/16/19   [provider]  cyclobenzaprine (FLEXERIL) 10 MG tablet Take 10 mg by mouth 3 (three) times daily as needed for muscle spasms.  07/05/19    [provider]  diclofenac Sodium (VOLTAREN) 1 % GEL Apply 2 g topically 4 (four) times daily. 09/06/19   Couture, Cortni S, PA-C  FARXIGA 10 MG TABS tablet Take 10 mg by mouth daily. 06/14/21   [provider]  fluticasone (FLONASE) 50 MCG/ACT nasal spray Place 1 spray into both nostrils 2 (two) times daily. 05/28/22   Sudie Grumbling, NP  HUMULIN 70/30 (70-30) 100 UNIT/ML injection Inject 45 Units into the skin 2 (two) times daily. 01/24/19   [provider]  irbesartan (AVAPRO) 300 MG tablet Take 300 mg by mouth daily.    [provider]  metoprolol succinate (TOPROL-XL) 25 MG 24 hr tablet Take 25 mg by mouth daily. 06/14/21   [provider]  montelukast (SINGULAIR) 10 MG tablet Take 1 tablet (10 mg total) by mouth at bedtime. 05/28/22   Sudie Grumbling, NP  [facility] ULTRA test strip 1 each by Other route in the morning and at bedtime. 07/15/19   [provider]  pravastatin (PRAVACHOL) 10 MG tablet Take 10 mg by mouth at bedtime. 07/08/21   [provider]  tamsulosin (FLOMAX) 0.4 MG CAPS capsule Take 0.4 mg by mouth at bedtime. 12/19/22   [provider]      Allergies    Laxative [bisacodyl]    Review of Systems   Review of Systems  Gastrointestinal:  Positive for constipation.  Gen: No fevers Eyes: No  vision changes HEENT: no congestion, sore throat Neck: no neck stiffness Resp: no cough, shortness of breath Card: no chest pain Abd:  see HPI Extremities: no leg swelling Neuro: no weakness, numbness, tingling Skin: no rashes   Physical Exam Updated Vital Signs BP 120/60   Pulse (!) 44   Temp 98.4 F (36.9 C)   Resp 13   SpO2 100%  Physical Exam Vitals and nursing note reviewed.   Gen: NAD Eyes: PERRL, EOMI HEENT: no oropharyngeal swelling Neck: trachea midline Resp: clear to auscultation bilaterally Card: RRR, no murmurs, rubs, or gallops Abd: Mildly distended, the patient is tender over the left  upper quadrant and left periumbilical region without guarding or rebound Extremities: no calf tenderness, no edema Vascular: 2+ radial pulses bilaterally, 2+ DP pulses bilaterally Skin: no rashes Psyc: acting appropriately   ED Results / Procedures / Treatments   Labs (all labs ordered are listed, but only abnormal results are displayed) Labs Reviewed  COMPREHENSIVE METABOLIC PANEL - Abnormal; Notable for the following components:      Result Value   CO2 20 (*)    Glucose, Bld 102 (*)    BUN 30 (*)    Creatinine, Ser 2.52 (*)    GFR, Estimated 24 (*)    All other components within normal limits  LIPASE, BLOOD - Abnormal; Notable for the following components:   Lipase 52 (*)    All other components within normal limits  CBC WITH DIFFERENTIAL/PLATELET - Abnormal; Notable for the following components:   Hemoglobin 12.9 (*)    All other components within normal limits  URINALYSIS, ROUTINE W REFLEX MICROSCOPIC    EKG None  Radiology CT ABDOMEN PELVIS WO CONTRAST  Result Date: 01/02/2023 CLINICAL DATA:  Abdominal pain EXAM: CT ABDOMEN AND PELVIS WITHOUT CONTRAST TECHNIQUE: Multidetector CT imaging of the abdomen and pelvis was performed following the standard protocol without IV contrast. RADIATION DOSE REDUCTION: This exam was performed according to the departmental dose-optimization program which includes automated exposure control, adjustment of the mA and/or kV according to patient size and/or use of iterative reconstruction technique. COMPARISON:  CT 12/30/2022 without contrast FINDINGS: Lower chest: Chronic lung changes seen at the lung bases including areas of bronchiectasis. Interstitial septal thickening. No pleural effusion. Coronary artery calcifications are seen. Hepatobiliary: Stone in the nondilated gallbladder. On this non IV contrast exam there is grossly preserved hepatic parenchyma Pancreas: Unremarkable. No pancreatic ductal dilatation or surrounding inflammatory  changes. Spleen: Normal in size without focal abnormality. 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 Left adrenal gland is diffusely thickened, hypertrophic. Mild-to-moderate perinephric stranding on the left. No left renal or ureteral stone once again there is cyst along the anterior aspect of the left kidney measuring 3.8 cm Hounsfield unit of 7, unchanged from previous. Smaller exophytic cyst as well laterally, unchanged from previous. Bosniak 1 and 2 lesions. No specific imaging follow-up. Right kidney also has a central cyst measuring 2.7 cm in maximum dimension with Hounsfield units of 10. There is mild perinephric stranding on the right. Lower pole nonobstructing 17 mm stone again seen, staghorn like in the right kidney. No right ureteral stone. Preserved contours of the urinary bladder. Stomach/Bowel: On this non oral contrast exam, the large bowel has a normal course and caliber. Redundant course of the sigmoid colon into the anterior midabdomen. Diffuse colonic stool identified. Normal appendix in the anterior right hemipelvis. The right side of the colon proximally is more collapsed which may be responsible for  the short-segment wall thickening along the ascending colon. Stomach is underdistended. Small bowel is nondilated. Vascular/Lymphatic: Aortic atherosclerosis. No enlarged abdominal or pelvic lymph nodes. Reproductive: Prostate is enlarged. Please correlate with patient's PSA. Other: No free intra-air or free fluid. Musculoskeletal: Degenerative changes seen along the spine and pelvis. Overall moderate. Multilevel stenosis. There is also bridging osteophytes along the sacroiliac joints. IMPRESSION: No bowel obstruction, free air or free fluid. Normal appendix. Decreasing colonic stool. Nonobstructing large right-sided starting bandlike lower pole renal stone. Gallstone. Overall no significant interval change from the recent prior Electronically Signed   By: Karen Kays M.D.   On: 01/02/2023 09:23    Procedures Procedures    Medications Ordered in ED Medications - No data to display  ED Course/ Medical Decision Making/ A&P                                 Medical Decision Making 87 year old male with past medical history of diabetes and hypertension presenting to the emergency department today with abdominal discomfort, nausea, and constipation.  The patient did have a CT scan performed a few days ago which did not show any findings of obstruction.  Given his worsening symptoms I will repeat a CT scan today with oral contrast.  I will give the patient Gastrografin here in the hopes that if this is due to constipation this may help with this.  Will also repeat labs to evaluate for hepatobetter pathology or pancreatitis.  I will reevaluate for ultimate disposition.  The patient's CT scan did not show any acute findings.  He remains well-appearing here.  I think that he is stable for discharge.  I have encouraged him to take Senokot when he gets home.  He does have some at home.  He is also encouraged to start Metamucil as a fiber supplement.  He does have a gastroenterologist.  He is encouraged to follow-up with them and to return to the emergency department for worsening symptoms.  Amount and/or Complexity of Data Reviewed Labs: ordered. Radiology: ordered.           Final Clinical Impression(s) / ED Diagnoses Final diagnoses:  Constipation, unspecified constipation type  Disposition: discharge  Rx / DC Orders ED Discharge Orders     None         Durwin Glaze, MD 01/02/23 1016

## 2023-01-02 NOTE — Discharge Instructions (Signed)
Please take 2 tablets of the Senokot when you get home.  You may continue taking the MiraLAX.  If you do not have any results with the 2 tablets of Senokot please take 4 tablets of the Senokot this evening.  Please go to the drugstore and pick up some sugar-free Metamucil and start this daily as well.  You may continue the MiraLAX as well as the Senokot over the next few days until you have improvement.  Please return to the emergency department if you develop pain, vomiting, fevers, or other concerning symptoms.  Please call your gastroenterologist to schedule a follow-up appointment.  Once you start having normal bowel movements please stop the Senokot and continue the Metamucil.

## 2023-01-02 NOTE — ED Triage Notes (Signed)
Pt seen here on 8/27 for constipation and given soap suds enema with BM here. Returns today since he has not had a BM since leaving the hospital, despite continuing to use Miralax. Denies abdominal pain, n/v and states is still passing gas.

## 2023-01-03 DIAGNOSIS — K5900 Constipation, unspecified: Secondary | ICD-10-CM | POA: Diagnosis not present

## 2023-01-07 DIAGNOSIS — R350 Frequency of micturition: Secondary | ICD-10-CM | POA: Diagnosis not present

## 2023-01-13 DIAGNOSIS — K5900 Constipation, unspecified: Secondary | ICD-10-CM | POA: Diagnosis not present

## 2023-01-13 DIAGNOSIS — Z794 Long term (current) use of insulin: Secondary | ICD-10-CM | POA: Diagnosis not present

## 2023-01-13 DIAGNOSIS — E1122 Type 2 diabetes mellitus with diabetic chronic kidney disease: Secondary | ICD-10-CM | POA: Diagnosis not present

## 2023-01-16 DIAGNOSIS — H25012 Cortical age-related cataract, left eye: Secondary | ICD-10-CM | POA: Diagnosis not present

## 2023-01-16 DIAGNOSIS — H25813 Combined forms of age-related cataract, bilateral: Secondary | ICD-10-CM | POA: Diagnosis not present

## 2023-01-16 DIAGNOSIS — H524 Presbyopia: Secondary | ICD-10-CM | POA: Diagnosis not present

## 2023-01-23 ENCOUNTER — Other Ambulatory Visit: Payer: Self-pay | Admitting: Gastroenterology

## 2023-01-26 ENCOUNTER — Ambulatory Visit
Admission: RE | Admit: 2023-01-26 | Discharge: 2023-01-26 | Disposition: A | Discharge status: Home / Self Care | Payer: Medicare Other | Source: Ambulatory Visit | Attending: Gastroenterology

## 2023-01-26 ENCOUNTER — Other Ambulatory Visit: Payer: Self-pay | Admitting: Gastroenterology

## 2023-01-26 DIAGNOSIS — K5904 Chronic idiopathic constipation: Secondary | ICD-10-CM

## 2023-01-26 DIAGNOSIS — K5909 Other constipation: Secondary | ICD-10-CM | POA: Diagnosis not present

## 2023-01-28 ENCOUNTER — Emergency Department (HOSPITAL_COMMUNITY): Payer: Medicare Other

## 2023-01-28 ENCOUNTER — Other Ambulatory Visit: Payer: Self-pay

## 2023-01-28 ENCOUNTER — Encounter (HOSPITAL_COMMUNITY): Payer: Self-pay

## 2023-01-28 ENCOUNTER — Emergency Department (HOSPITAL_COMMUNITY)
Admission: EM | Admit: 2023-01-28 | Discharge: 2023-01-28 | Disposition: A | Discharge status: Home / Self Care | Payer: Medicare Other | Attending: Emergency Medicine | Admitting: Emergency Medicine

## 2023-01-28 ENCOUNTER — Other Ambulatory Visit: Payer: Self-pay | Admitting: Gastroenterology

## 2023-01-28 ENCOUNTER — Ambulatory Visit
Admission: RE | Admit: 2023-01-28 | Discharge: 2023-01-28 | Disposition: A | Discharge status: Home / Self Care | Payer: Medicare Other | Source: Ambulatory Visit | Attending: Gastroenterology | Admitting: Gastroenterology

## 2023-01-28 DIAGNOSIS — N189 Chronic kidney disease, unspecified: Secondary | ICD-10-CM | POA: Insufficient documentation

## 2023-01-28 DIAGNOSIS — K5909 Other constipation: Secondary | ICD-10-CM | POA: Diagnosis not present

## 2023-01-28 DIAGNOSIS — E1122 Type 2 diabetes mellitus with diabetic chronic kidney disease: Secondary | ICD-10-CM | POA: Insufficient documentation

## 2023-01-28 DIAGNOSIS — R42 Dizziness and giddiness: Secondary | ICD-10-CM | POA: Diagnosis not present

## 2023-01-28 DIAGNOSIS — W19XXXA Unspecified fall, initial encounter: Secondary | ICD-10-CM

## 2023-01-28 DIAGNOSIS — W1830XA Fall on same level, unspecified, initial encounter: Secondary | ICD-10-CM | POA: Insufficient documentation

## 2023-01-28 DIAGNOSIS — J181 Lobar pneumonia, unspecified organism: Secondary | ICD-10-CM | POA: Insufficient documentation

## 2023-01-28 DIAGNOSIS — S0003XA Contusion of scalp, initial encounter: Secondary | ICD-10-CM | POA: Insufficient documentation

## 2023-01-28 DIAGNOSIS — I951 Orthostatic hypotension: Secondary | ICD-10-CM | POA: Insufficient documentation

## 2023-01-28 DIAGNOSIS — R918 Other nonspecific abnormal finding of lung field: Secondary | ICD-10-CM | POA: Diagnosis not present

## 2023-01-28 DIAGNOSIS — I129 Hypertensive chronic kidney disease with stage 1 through stage 4 chronic kidney disease, or unspecified chronic kidney disease: Secondary | ICD-10-CM | POA: Diagnosis not present

## 2023-01-28 DIAGNOSIS — Z794 Long term (current) use of insulin: Secondary | ICD-10-CM | POA: Insufficient documentation

## 2023-01-28 DIAGNOSIS — K5904 Chronic idiopathic constipation: Secondary | ICD-10-CM

## 2023-01-28 DIAGNOSIS — Z79899 Other long term (current) drug therapy: Secondary | ICD-10-CM | POA: Diagnosis not present

## 2023-01-28 DIAGNOSIS — S0990XA Unspecified injury of head, initial encounter: Secondary | ICD-10-CM

## 2023-01-28 DIAGNOSIS — M25551 Pain in right hip: Secondary | ICD-10-CM | POA: Diagnosis not present

## 2023-01-28 DIAGNOSIS — Z981 Arthrodesis status: Secondary | ICD-10-CM | POA: Diagnosis not present

## 2023-01-28 DIAGNOSIS — J439 Emphysema, unspecified: Secondary | ICD-10-CM | POA: Diagnosis not present

## 2023-01-28 DIAGNOSIS — J189 Pneumonia, unspecified organism: Secondary | ICD-10-CM

## 2023-01-28 LAB — CBC WITH DIFFERENTIAL/PLATELET
Abs Immature Granulocytes: 0.01 10*3/uL (ref 0.00–0.07)
Basophils Absolute: 0 10*3/uL (ref 0.0–0.1)
Basophils Relative: 1 %
Eosinophils Absolute: 0 10*3/uL (ref 0.0–0.5)
Eosinophils Relative: 1 %
HCT: 36.2 % — ABNORMAL LOW (ref 39.0–52.0)
Hemoglobin: 11.8 g/dL — ABNORMAL LOW (ref 13.0–17.0)
Immature Granulocytes: 0 %
Lymphocytes Relative: 18 %
Lymphs Abs: 0.6 10*3/uL — ABNORMAL LOW (ref 0.7–4.0)
MCH: 28.2 pg (ref 26.0–34.0)
MCHC: 32.6 g/dL (ref 30.0–36.0)
MCV: 86.6 fL (ref 80.0–100.0)
Monocytes Absolute: 0.9 10*3/uL (ref 0.1–1.0)
Monocytes Relative: 27 %
Neutro Abs: 1.8 10*3/uL (ref 1.7–7.7)
Neutrophils Relative %: 53 %
Platelets: 143 10*3/uL — ABNORMAL LOW (ref 150–400)
RBC: 4.18 MIL/uL — ABNORMAL LOW (ref 4.22–5.81)
RDW: 13.2 % (ref 11.5–15.5)
WBC: 3.3 10*3/uL — ABNORMAL LOW (ref 4.0–10.5)
nRBC: 0 % (ref 0.0–0.2)

## 2023-01-28 LAB — COMPREHENSIVE METABOLIC PANEL
ALT: 19 U/L (ref 0–44)
AST: 19 U/L (ref 15–41)
Albumin: 3.8 g/dL (ref 3.5–5.0)
Alkaline Phosphatase: 58 U/L (ref 38–126)
Anion gap: 9 (ref 5–15)
BUN: 43 mg/dL — ABNORMAL HIGH (ref 8–23)
CO2: 20 mmol/L — ABNORMAL LOW (ref 22–32)
Calcium: 9.5 mg/dL (ref 8.9–10.3)
Chloride: 102 mmol/L (ref 98–111)
Creatinine, Ser: 2.74 mg/dL — ABNORMAL HIGH (ref 0.61–1.24)
GFR, Estimated: 22 mL/min — ABNORMAL LOW (ref >60)
Glucose, Bld: 97 mg/dL (ref 70–99)
Potassium: 4.6 mmol/L (ref 3.5–5.1)
Sodium: 131 mmol/L — ABNORMAL LOW (ref 135–145)
Total Bilirubin: 0.8 mg/dL (ref 0.3–1.2)
Total Protein: 7.2 g/dL (ref 6.5–8.1)

## 2023-01-28 LAB — D-DIMER, QUANTITATIVE: D-Dimer, Quant: 2.02 ug/mL-FEU — ABNORMAL HIGH (ref 0.00–0.50)

## 2023-01-28 LAB — MAGNESIUM: Magnesium: 2.3 mg/dL (ref 1.7–2.4)

## 2023-01-28 LAB — TROPONIN I (HIGH SENSITIVITY)
Troponin I (High Sensitivity): 9 ng/L (ref <18)
Troponin I (High Sensitivity): 10 ng/L (ref <18)

## 2023-01-28 LAB — CBG MONITORING, ED: Glucose-Capillary: 87 mg/dL (ref 70–99)

## 2023-01-28 MED ORDER — SODIUM CHLORIDE 0.9 % IV SOLN
2.0000 g | Freq: Once | INTRAVENOUS | Status: AC
  Administered 2023-01-28: 2 g via INTRAVENOUS
  Filled 2023-01-28: qty 20

## 2023-01-28 MED ORDER — TECHNETIUM TO 99M ALBUMIN AGGREGATED
4.0000 | Freq: Once | INTRAVENOUS | Status: AC | PRN
  Administered 2023-01-28: 4 via INTRAVENOUS

## 2023-01-28 MED ORDER — LACTATED RINGERS IV BOLUS
500.0000 mL | Freq: Once | INTRAVENOUS | Status: AC
  Administered 2023-01-28: 500 mL via INTRAVENOUS

## 2023-01-28 MED ORDER — SODIUM CHLORIDE 0.9 % IV SOLN
500.0000 mg | Freq: Once | INTRAVENOUS | Status: AC
  Administered 2023-01-28: 500 mg via INTRAVENOUS
  Filled 2023-01-28: qty 5

## 2023-01-28 MED ORDER — AMOXICILLIN-POT CLAVULANATE 500-125 MG PO TABS
1.0000 | ORAL_TABLET | Freq: Two times a day (BID) | ORAL | 0 refills | Status: AC
Start: 2023-01-28 — End: 2023-02-02

## 2023-01-28 MED ORDER — AZITHROMYCIN 250 MG PO TABS: 250.0000 mg | ORAL_TABLET | Freq: Every day | ORAL | 0 refills | Status: DC

## 2023-01-28 NOTE — Discharge Instructions (Addendum)
You were seen for your dizziness, shortness of breath, and cough in the emergency department.  You were found to have pneumonia.  At home, please take the antibiotics (Augmentin and azithromycin) that we have prescribed you and stay well-hydrated.    Check your MyChart online for the results of any tests that had not resulted by the time you left the emergency department.   Follow-up with your primary doctor in 2-3 days regarding your visit.    Return immediately to the emergency department if you experience any of the following: Difficulty breathing, recurrent falls, or any other concerning symptoms.    Thank you for visiting our Emergency Department. It was a pleasure taking care of you today.

## 2023-01-28 NOTE — ED Provider Notes (Signed)
[place] EMERGENCY DEPARTMENT AT [facility] Provider Note   CSN: [id number] Arrival date & time: 01/28/23  3474     History  Chief Complaint  Patient presents with   Fall   Dizziness    Brandon Weaver is a 87 y.o. male.  87 year old male with a history of hypertension, hyperlipidemia, diabetes, and CKD who presents to the emergency department after a fall.  Patient reports that this morning at approximately 5 AM was urinating and 20 finished started to feel very lightheaded and fell to the ground.  Did hit his head on the way down.  No LOC.  No chest pain. Has some shortness of breath that has been worsening recently.  Also with a productive cough. No fevers. Did covid test yesterday that was negative. Has had multiple falls recently and reports some left hip pain that he sustained in a previous fall.  Also reports that he has had some constipation for months that is being worked up as an outpatient.  Has already had multiple CTs and abdominal x-rays.  Is up to get anorectal manometry. Has had decreased PO intake from that.        Home Medications Prior to Admission medications   Medication Sig Start Date End Date Taking? Authorizing Provider  amoxicillin-clavulanate (AUGMENTIN) 500-125 MG tablet Take 1 tablet by mouth in the morning and at bedtime for 5 days. 01/28/23 02/02/23 Yes Rondel Baton, MD  azithromycin (ZITHROMAX) 250 MG tablet Take 1 tablet (250 mg total) by mouth daily. Take first 2 tablets together, then 1 every day until finished. 01/28/23  Yes Rondel Baton, MD  amLODipine (NORVASC) 5 MG tablet Take 5 mg by mouth daily. 12/17/18   [provider]  azelastine (ASTELIN) 0.1 % nasal spray Place 2 sprays into both nostrils 2 (two) times daily. Use in each nostril as directed Patient not taking: Reported on 05/28/2022 12/12/21   Ellsworth Lennox, PA-C  carboxymethylcellulose (REFRESH PLUS) 0.5 % SOLN Place 1-2 drops into both eyes 3 (three) times  daily as needed (dry/irritated eyes.).    [provider]  Cholecalciferol (VITAMIN D3) 1.25 MG (50000 UT) CAPS Take 1 capsule by mouth once a week. 05/16/19   [provider]  cyclobenzaprine (FLEXERIL) 10 MG tablet Take 10 mg by mouth 3 (three) times daily as needed for muscle spasms.  07/05/19   [provider]  diclofenac Sodium (VOLTAREN) 1 % GEL Apply 2 g topically 4 (four) times daily. 09/06/19   Couture, Cortni S, PA-C  FARXIGA 10 MG TABS tablet Take 10 mg by mouth daily. 06/14/21   [provider]  fluticasone (FLONASE) 50 MCG/ACT nasal spray Place 1 spray into both nostrils 2 (two) times daily. 05/28/22   Sudie Grumbling, NP  HUMULIN 70/30 (70-30) 100 UNIT/ML injection Inject 45 Units into the skin 2 (two) times daily. 01/24/19   [provider]  irbesartan (AVAPRO) 300 MG tablet Take 300 mg by mouth daily.    [provider]  metoprolol succinate (TOPROL-XL) 25 MG 24 hr tablet Take 25 mg by mouth daily. 06/14/21   [provider]  montelukast (SINGULAIR) 10 MG tablet Take 1 tablet (10 mg total) by mouth at bedtime. 05/28/22   Sudie Grumbling, NP  [facility] ULTRA test strip 1 each by Other route in the morning and at bedtime. 07/15/19   [provider]  pravastatin (PRAVACHOL) 10 MG tablet Take 10 mg by mouth at bedtime. 07/08/21   [provider]  tamsulosin (FLOMAX) 0.4 MG CAPS capsule Take 0.4 mg by mouth at bedtime. 12/19/22   [provider]      Allergies    Laxative [bisacodyl]    Review of Systems   Review of Systems  Physical Exam Updated Vital Signs BP 126/68   Pulse 67   Temp 97.9 F (36.6 C) (Oral)   Resp 14   Ht 5\' 10"  (1.778 m)   Wt 90.3 kg   SpO2 100%   BMI 28.55 kg/m  Physical Exam Constitutional:      General: He is not in acute distress.    Appearance: Normal appearance. He is not ill-appearing.  HENT:     Head: Normocephalic.     Comments: Occipital hematoma    Right Ear:  External ear normal.     Left Ear: External ear normal.     Mouth/Throat:     Mouth: Mucous membranes are moist.     Pharynx: Oropharynx is clear.  Eyes:     Extraocular Movements: Extraocular movements intact.     Conjunctiva/sclera: Conjunctivae normal.     Pupils: Pupils are equal, round, and reactive to light.  Neck:     Comments: No C-spine midline tenderness to palpation Cardiovascular:     Rate and Rhythm: Normal rate and regular rhythm.     Pulses: Normal pulses.     Heart sounds: Normal heart sounds.  Pulmonary:     Effort: Pulmonary effort is normal. No respiratory distress.     Breath sounds: Normal breath sounds.  Abdominal:     General: Abdomen is flat. There is no distension.     Palpations: Abdomen is soft.     Tenderness: There is no abdominal tenderness. There is no guarding.  Musculoskeletal:        General: No deformity. Normal range of motion.     Cervical back: No rigidity or tenderness.     Comments: No tenderness to palpation of midline thoracic or lumbar spine.  No step-offs palpated.  No tenderness to palpation of chest wall.  No bruising noted.  No tenderness to palpation of bilateral clavicles.  No tenderness to palpation, bruising, or deformities noted of bilateral shoulders, elbows, wrists, hips, knees, or ankles.  Neurological:     General: No focal deficit present.     Mental Status: He is alert and oriented to person, place, and time.     Cranial Nerves: No cranial nerve deficit.     Sensory: No sensory deficit.     Motor: No weakness.     ED Results / Procedures / Treatments   Labs (all labs ordered are listed, but only abnormal results are displayed) Labs Reviewed  COMPREHENSIVE METABOLIC PANEL - Abnormal; Notable for the following components:      Result Value   Sodium 131 (*)    CO2 20 (*)    BUN 43 (*)    Creatinine, Ser 2.74 (*)    GFR, Estimated 22 (*)    All other components within normal limits  D-DIMER, QUANTITATIVE -  Abnormal; Notable for the following components:   D-Dimer, Quant 2.02 (*)    All other components within normal limits  CBC WITH DIFFERENTIAL/PLATELET - Abnormal; Notable for the following components:   WBC 3.3 (*)    RBC 4.18 (*)    Hemoglobin 11.8 (*)    HCT 36.2 (*)    Platelets 143 (*)    Lymphs Abs 0.6 (*)    All other components  within normal limits  CULTURE, BLOOD (ROUTINE X 2)  CULTURE, BLOOD (ROUTINE X 2)  MAGNESIUM  CBC WITH DIFFERENTIAL/PLATELET  CBG MONITORING, ED  TROPONIN I (HIGH SENSITIVITY)  TROPONIN I (HIGH SENSITIVITY)    EKG EKG Interpretation Date/Time:  Wednesday January 28 2023 09:40:13 EDT Ventricular Rate:  82 PR Interval:  139 QRS Duration:  141 QT Interval:  406 QTC Calculation: 451 R Axis:   -66  Text Interpretation: Sinus rhythm Atrial premature complexes RBBB and LAFB Confirmed by Vonita Moss [phone]) on 01/28/2023 9:51:07 AM  Radiology NM Pulmonary Perfusion  Result Date: 01/28/2023 CLINICAL DATA:  Concern for pulmonary embolism. Intermediate. Positive D-dimer. EXAM: NUCLEAR MEDICINE PERFUSION LUNG SCAN TECHNIQUE: Perfusion images were obtained in multiple projections after intravenous injection of radiopharmaceutical. RADIOPHARMACEUTICALS:  4.0 mCi Tc-91m MAA COMPARISON:  Chest radiograph 01/28/2023, CT abdomen 01/02/2023, pulmonary perfusion exam 09/06/2019 FINDINGS: Heterogeneous perfusion pattern. Several regions of decreased perfusion match decreased regional perfusion on nuclear medicine perfusion scan 09/06/2019. lungs are hyperinflated on chest radiograph. Emphysema lung bases on CT 01/02/2023. IMPRESSION: 1. No evidence acute pulmonary embolism. 2. Heterogeneous perfusion pattern which matches prior perfusion exam. Favor findings related to emphysema. Electronically Signed   By: Genevive Bi M.D.   On: 01/28/2023 15:13   DG Abd 1 View  Result Date: 01/28/2023 CLINICAL DATA:  Chronic constipation. EXAM: ABDOMEN - 1 VIEW COMPARISON:   Abdominal x-ray dated January 26, 2023. CT abdomen pelvis dated January 02, 2023. FINDINGS: Unchanged chronically dilated redundant sigmoid colon in the right abdomen. Sitz markers are no longer identified. Normal small bowel. Unchanged large calculus in the lower pole of the right kidney. No acute osseous abnormality. IMPRESSION: 1. Unchanged chronically dilated redundant sigmoid colon in the right abdomen. Sitz markers are no longer identified. 2. Unchanged right nephrolithiasis. Electronically Signed   By: Obie Dredge M.D.   On: 01/28/2023 12:26   DG Chest 2 View  Result Date: 01/28/2023 CLINICAL DATA:  Dizziness with fall. EXAM: CHEST - 2 VIEW COMPARISON:  Chest x-ray dated July 10, 2021. FINDINGS: The heart size and mediastinal contours are within normal limits. Normal pulmonary vascularity. Increased peribronchial thickening in a lower lobe on the lateral view, most likely the retrocardiac left lower lobe. No pleural effusion or pneumothorax. No acute osseous abnormality. IMPRESSION: 1. Increased peribronchial thickening in the retrocardiac left lower lobe, concerning for bronchopneumonia. Electronically Signed   By: Obie Dredge M.D.   On: 01/28/2023 12:20   DG Hip Unilat W or Wo Pelvis 2-3 Views Right  Result Date: 01/28/2023 CLINICAL DATA:  Right hip pain status post fall EXAM: DG HIP (WITH OR WITHOUT PELVIS) 2-3V RIGHT COMPARISON:  None Available. FINDINGS: No acute fracture or dislocation. No aggressive osseous lesion. Normal alignment. Soft tissue are unremarkable. No radiopaque foreign body or soft tissue emphysema. Peripheral vascular atherosclerotic disease. IMPRESSION: 1. No acute osseous injury of the right hip. If there is further clinical concern, recommend a CT of the right hip. Electronically Signed   By: Elige Ko M.D.   On: 01/28/2023 12:17   CT Head Wo Contrast  Result Date: 01/28/2023 CLINICAL DATA:  Fall and dizziness EXAM: CT HEAD WITHOUT CONTRAST CT CERVICAL SPINE  WITHOUT CONTRAST TECHNIQUE: Multidetector CT imaging of the head and cervical spine was performed following the standard protocol without intravenous contrast. Multiplanar CT image reconstructions of the cervical spine were also generated. RADIATION DOSE REDUCTION: This exam was performed according to the departmental dose-optimization program which includes automated exposure control, adjustment of  the mA and/or kV according to patient size and/or use of iterative reconstruction technique. COMPARISON:  None Available. FINDINGS: CT HEAD FINDINGS Brain: No evidence of acute infarction, hemorrhage, hydrocephalus, extra-axial collection or mass lesion/mass effect. Vascular: No hyperdense vessel or unexpected calcification. Skull: Normal. Negative for fracture or focal lesion. Sinuses/Orbits: No evidence of injury. Presumed retention cyst in the left maxillary sinus. CT CERVICAL SPINE FINDINGS Alignment: No traumatic malalignment. Reversal of cervical lordosis and dextrocurvature. Skull base and vertebrae: No acute fracture or incidental bone lesion. Subjective osteopenia Soft tissues and spinal canal: No prevertebral fluid or swelling. No visible canal hematoma. Disc levels: C3-4 ACDF with solid arthrodesis. C6-T1 spondylosis and ankylosis. Upper chest: No evidence of injury IMPRESSION: No evidence of acute intracranial or cervical spine injury. Electronically Signed   By: Tiburcio Pea M.D.   On: 01/28/2023 10:46   CT Cervical Spine Wo Contrast  Result Date: 01/28/2023 CLINICAL DATA:  Fall and dizziness EXAM: CT HEAD WITHOUT CONTRAST CT CERVICAL SPINE WITHOUT CONTRAST TECHNIQUE: Multidetector CT imaging of the head and cervical spine was performed following the standard protocol without intravenous contrast. Multiplanar CT image reconstructions of the cervical spine were also generated. RADIATION DOSE REDUCTION: This exam was performed according to the departmental dose-optimization program which includes  automated exposure control, adjustment of the mA and/or kV according to patient size and/or use of iterative reconstruction technique. COMPARISON:  None Available. FINDINGS: CT HEAD FINDINGS Brain: No evidence of acute infarction, hemorrhage, hydrocephalus, extra-axial collection or mass lesion/mass effect. Vascular: No hyperdense vessel or unexpected calcification. Skull: Normal. Negative for fracture or focal lesion. Sinuses/Orbits: No evidence of injury. Presumed retention cyst in the left maxillary sinus. CT CERVICAL SPINE FINDINGS Alignment: No traumatic malalignment. Reversal of cervical lordosis and dextrocurvature. Skull base and vertebrae: No acute fracture or incidental bone lesion. Subjective osteopenia Soft tissues and spinal canal: No prevertebral fluid or swelling. No visible canal hematoma. Disc levels: C3-4 ACDF with solid arthrodesis. C6-T1 spondylosis and ankylosis. Upper chest: No evidence of injury IMPRESSION: No evidence of acute intracranial or cervical spine injury. Electronically Signed   By: Tiburcio Pea M.D.   On: 01/28/2023 10:46    Procedures Procedures    Medications Ordered in ED Medications  lactated ringers bolus 500 mL (0 mLs Intravenous Stopped 01/28/23 1144)  lactated ringers bolus 500 mL (0 mLs Intravenous Stopped 01/28/23 1144)  cefTRIAXone (ROCEPHIN) 2 g in sodium chloride 0.9 % 100 mL IVPB (0 g Intravenous Stopped 01/28/23 1410)  azithromycin (ZITHROMAX) 500 mg in sodium chloride 0.9 % 250 mL IVPB (0 mg Intravenous Stopped 01/28/23 1500)  technetium albumin aggregated (MAA) injection solution 4 millicurie (4 millicuries Intravenous Contrast Given 01/28/23 1230)    ED Course/ Medical Decision Making/ A&P Clinical Course as of 01/28/23 2013  Wed Jan 28, 2023  1040 Orthostatics positive [RP]  1105 Creatinine(!): 2.74 Baseline of 2.5 [RP]    Clinical Course User Index [RP] Rondel Baton, MD                                 Medical Decision  Making Amount and/or Complexity of Data Reviewed Labs: ordered. Decision-making details documented in ED Course. Radiology: ordered.  Risk Prescription drug management.   Brandon Weaver is a 87 y.o. male with comorbidities that complicate the patient evaluation including hypertension, hyperlipidemia, diabetes, and CKD who presents to the emergency department after a fall.    Initial Ddx:  TBI, C-spine injury, hip fracture, pneumonia, URI, dehydration, bowel obstruction, PE  MDM/Course:  Patient presents emergency department after a fall.  This was in the setting of a presyncopal event when he was urinating.  Does appear that he has had decreased p.o. intake recently as well.  Also has been having a cough but is satting well on room air.  No obvious traumatic injuries on exam aside from very small hematoma on his occiput.  Patient had a CT of the head and C-spine did not show traumatic injury.  Hip x-ray without fracture.  Also had serial troponins that were WNL.  Orthostatics were positive and he was given a liter of fluid and was feeling better.  Chest x-ray did show left lower lobe infiltrate and he was started on ceftriaxone and azithromycin.  Also had a D-dimer that was sent he was exposed to but no presyncope and was elevated so he underwent a VQ scan that did not show evidence of pulmonary embolism.  Upon re-evaluation patient was feeling better.  Had extensive discussion with the patient and his wife and we are concerned about his repetitive falls.  Did discuss disposition options with them including PT eval in the emergency department, admission, and discharged home.  They request to go home at this time.  Will give him antibiotics and have him follow-up with his primary doctor.  SPECT that his presyncopal event was likely due to dehydration.  Of note, did consider bowel obstruction but given the patient's already extensive workup stool of this is less likely.  Did not have significant  distention of his abdomen or tenderness palpation.  Not reporting significant nausea and vomiting or abdominal pain at this time.  This patient presents to the ED for concern of complaints listed in HPI, this involves an extensive number of treatment options, and is a complaint that carries with it a high risk of complications and morbidity. Disposition including potential need for admission considered.   Dispo: DC Home. Return precautions discussed including, but not limited to, those listed in the AVS. Allowed pt time to ask questions which were answered fully prior to dc.  Additional history obtained from spouse Records reviewed Outpatient Clinic Notes The following labs were independently interpreted: Chemistry and show CKD I independently reviewed the following imaging with scope of interpretation limited to determining acute life threatening conditions related to emergency care: CT Head and agree with the radiologist interpretation with the following exceptions: none I personally reviewed and interpreted the pt's EKG: see above for interpretation  I have reviewed the patients home medications and made adjustments as needed Social Determinants of health:  Elderly         Final Clinical Impression(s) / ED Diagnoses Final diagnoses:  Pneumonia of left lower lobe due to infectious organism  Lightheadedness  Orthostasis  Fall, initial encounter  Minor head injury, initial encounter    Rx / DC Orders ED Discharge Orders          Ordered    amoxicillin-clavulanate (AUGMENTIN) 500-125 MG tablet  2 times daily        01/28/23 1545    azithromycin (ZITHROMAX) 250 MG tablet  Daily        01/28/23 1545              Rondel Baton, MD 01/28/23 2013

## 2023-01-28 NOTE — ED Notes (Signed)
Patient stated while standing during orthostatics, he became dizzy and nauseous.

## 2023-01-28 NOTE — ED Triage Notes (Signed)
Pt coming in from home. Pt has had recent falls. Pt reports falling 2x this week. Pt denies thinners. Pt denies loc. Pt fell this morning in the bathroom pt reports having a dizzy spell  and not being able to stand up. Pt reports that his wife came and got him up

## 2023-01-28 NOTE — ED Notes (Signed)
Patient transported to Nuclear Med

## 2023-01-29 LAB — CULTURE, BLOOD (ROUTINE X 2)
Special Requests: ADEQUATE
Special Requests: ADEQUATE

## 2023-02-02 LAB — CULTURE, BLOOD (ROUTINE X 2)
Culture: NO GROWTH
Culture: NO GROWTH

## 2023-02-03 DIAGNOSIS — H268 Other specified cataract: Secondary | ICD-10-CM | POA: Diagnosis not present

## 2023-02-03 DIAGNOSIS — H25812 Combined forms of age-related cataract, left eye: Secondary | ICD-10-CM | POA: Diagnosis not present

## 2023-02-03 DIAGNOSIS — H2512 Age-related nuclear cataract, left eye: Secondary | ICD-10-CM | POA: Diagnosis not present

## 2023-02-10 DIAGNOSIS — R350 Frequency of micturition: Secondary | ICD-10-CM | POA: Diagnosis not present

## 2023-02-10 DIAGNOSIS — N281 Cyst of kidney, acquired: Secondary | ICD-10-CM | POA: Diagnosis not present

## 2023-02-10 DIAGNOSIS — N184 Chronic kidney disease, stage 4 (severe): Secondary | ICD-10-CM | POA: Diagnosis not present

## 2023-02-10 DIAGNOSIS — D3501 Benign neoplasm of right adrenal gland: Secondary | ICD-10-CM | POA: Diagnosis not present

## 2023-02-12 DIAGNOSIS — Z9181 History of falling: Secondary | ICD-10-CM | POA: Diagnosis not present

## 2023-02-12 DIAGNOSIS — R55 Syncope and collapse: Secondary | ICD-10-CM | POA: Diagnosis not present

## 2023-02-12 DIAGNOSIS — I951 Orthostatic hypotension: Secondary | ICD-10-CM | POA: Diagnosis not present

## 2023-02-12 DIAGNOSIS — Z23 Encounter for immunization: Secondary | ICD-10-CM | POA: Diagnosis not present

## 2023-02-12 DIAGNOSIS — E1122 Type 2 diabetes mellitus with diabetic chronic kidney disease: Secondary | ICD-10-CM | POA: Diagnosis not present

## 2023-02-18 DIAGNOSIS — K5900 Constipation, unspecified: Secondary | ICD-10-CM | POA: Diagnosis not present

## 2023-02-18 DIAGNOSIS — I951 Orthostatic hypotension: Secondary | ICD-10-CM | POA: Diagnosis not present

## 2023-03-04 DIAGNOSIS — Q438 Other specified congenital malformations of intestine: Secondary | ICD-10-CM | POA: Diagnosis not present

## 2023-03-04 NOTE — Progress Notes (Signed)
 REFERRING PHYSICIAN:  Saintclair Jasper, MD  PROVIDER:  LONNI OZELL PIZZA, MD  MRN: [id number] DOB: [birth year] DATE OF ENCOUNTER: 03/04/2023  Subjective   Chief Complaint: Chronic constipation   History of Present Illness: Brandon Weaver is a 87 y.o. male with history of HTN, HLD, DM, CKD, whom is seen in the office today as a referral by Dr. Saintclair for evaluation of chronic constipation, possible colectomy.  Colonoscopy ? Unknown last scope date, patient believes 2019 and also believes he had a polyp or 2 removed.  We do not have a copy of that at this time though.  We also checked with [facility] directly and they did not have any records of any colonoscopies being completed since 2005.  Sitz Marker study - 9/23 IMPRESSION: 1. Sitz markers in the proximal descending colon and rectum. 2. Unchanged chronically dilated redundant sigmoid colon in the right abdomen. No obstruction. 3. Unchanged right nephrolithiasis.  9/25 IMPRESSION: 1. Unchanged chronically dilated redundant sigmoid colon in the right abdomen. Sitz markers are no longer identified. 2. Unchanged right nephrolithiasis.  He is here with his wife.  They do report a longstanding history of what they described to be chronic constipation.  He reports that with the aid of MiraLAX and Senokot, he has a loose bowel movement daily.  He reports that if he does not take a laxative he will have no bowel movement for many days.  He does report if he does not have a daily bowel movement that he has fairly significant abdominal cramps.  He has no reported history of sigmoid volvulus.  I have reviewed his imaging as well and I see no documentation of having had a prior sigmoid volvulus.  He does have a redundant loop of sigmoid colon.  PMH: HTN, HLD, DM, CKD, chronic constipation  PSH:  1.  Robotic assisted right adrenalectomy and retroperitoneal lymph node dissection with Dr. Christopher, 2020 for a right adrenal mass 2.  Exploratory  laparotomy with reflux surgery 1984, [facility] 3.  Rotator cuff surgery, 1990  FHx: Denies any known family history of colorectal, breast, endometrial or ovarian cancer  Social Hx: Denies use of tobacco/EtOH/illicit drug.  He is here today with his wife.   Review of Systems: A complete review of systems was obtained from the patient.  I have reviewed this information and discussed as appropriate with the patient.  See HPI as well for other ROS.  All of the below systems have been reviewed with the patient and positives are indicated with bold text General: chills, fever or night sweats Eyes: blurry vision or double vision ENT: epistaxis or sore throat Allergy/Immunology: itchy/watery eyes or nasal congestion Hematologic/Lymphatic: bleeding problems, blood clots or swollen lymph nodes Endocrine: temperature intolerance or unexpected weight changes Breast: new or changing breast lumps or nipple discharge Resp: cough, shortness of breath, or wheezing CV: chest pain or dyspnea on exertion GI: as per HPI GU: dysuria, trouble voiding, or hematuria MSK: joint pain or joint stiffness Neuro: TIA or stroke symptoms Derm: pruritus and skin lesion changes Psych: anxiety and depression   Medical History: Past Medical History:  Diagnosis Date  . Chronic kidney disease   . Diabetes mellitus without complication (CMS/HHS-HCC)   . GERD (gastroesophageal reflux disease)   . Sleep apnea     There is no problem list on file for this patient.   Past Surgical History:  Procedure Laterality Date  . ROBOT ASSISTED SINGLE INCISION LAPAROSCOPIC ADRENALECTOMY WITH EXPLORATION  ADRENAL GLAND       No Known Allergies  Current Outpatient Medications on File Prior to Visit  Medication Sig Dispense Refill  . irbesartan  (AVAPRO ) 300 MG tablet Take 300 mg by mouth once daily    . LINZESS 290 mcg capsule 1 capsule at least 30 minutes before the first meal of the day on an empty stomach  Orally Once a day    . polyethylene glycol (MIRALAX) powder Take 17 g by mouth once daily Mix in 4-8ounces of fluid prior to taking.    . pravastatin (PRAVACHOL) 10 MG tablet 1 tablet Orally Once a day for 90 days    . sennosides (SENNA LAX ORAL) Take by mouth     No current facility-administered medications on file prior to visit.    Family History  Problem Relation Age of Onset  . Stroke Sister   . High blood pressure (Hypertension) Sister      Social History   Tobacco Use  Smoking Status Former  . Types: Cigarettes  Smokeless Tobacco Never     Social History   Socioeconomic History  . Marital status: Married  Tobacco Use  . Smoking status: Former    Types: Cigarettes  . Smokeless tobacco: Never  Vaping Use  . Vaping status: Never Used  Substance and Sexual Activity  . Alcohol  use: Not Currently  . Drug use: Never    Objective:    Vitals:   03/04/23 1328 03/04/23 1333  BP: 110/64   Temp: 36.3 C (97.4 F)   Weight: 82.4 kg (181 lb 9.6 oz)   Height: 177.8 cm (5' 10)   PainSc:  0-No pain    Body mass index is 26.06 kg/m.  Constitutional: NAD; conversant Eyes: Moist conjunctiva; anicteric Lungs: Normal respiratory effort CV: RRR; no pitting edema GI: Abdomen is soft, NT/ND; no palpable hepatosplenomegaly Psychiatric: Appropriate affect   Assessment and Plan:  Diagnoses and all orders for this visit:  Redundant colon -     Ambulatory referral to Gastroenterology     Brandon Weaver is a very pleasant 87 y.o. male with hx of HTN, HLD, DM, CKD here for evaluation of chronic constipation  -May have been some confusion with regards to the nature of this appointment today.  We did spend time at least broaching the issue of what surgery may play in terms of her role for chronic constipation.  We did discuss that for medically refractory chronic constipation, the potential for a subtotal colectomy.  We discussed that he does have a somewhat redundant and  on some CT scans dilated sigmoid colon.  I do not think a small segmental resection would necessarily fix his chronic constipation but I have also reached out to Dr. Saintclair.  It is also not clear from his perspective that he is maximally medically managed his chronic constipation.  He does not believe he would like to undergo any sort of radical operation unless it was absolutely necessary for risk exposure purposes which I completely agree with.   -Unclear when his last colonoscopy was, I have reached out to Dr. Herschell for further clarification.  -He has had a sits marker study which he was able to clear all sits markers on the final follow-up film but there was clustering on the first film in his left colon, suggesting inertia may be less likely the cause of his symptoms.  -Anorectal manometry will certainly help clarify whether or not there could be also a pelvic floor contribution.  -We  will plan to see him back after he has a updated colonoscopy if necessary and has completed the anorectal manometry.  He has been encouraged to call with any questions or concerns.  Return in about 6 weeks (around 04/15/2023).  I spent a total of 65 minutes in both face-to-face and non-face-to-face activities, excluding procedures performed, for this visit on the date of this encounter.   Lonni Pizza, MD [facility]

## 2023-03-05 DIAGNOSIS — I10 Essential (primary) hypertension: Secondary | ICD-10-CM | POA: Diagnosis not present

## 2023-03-05 DIAGNOSIS — K5900 Constipation, unspecified: Secondary | ICD-10-CM | POA: Diagnosis not present

## 2023-03-11 ENCOUNTER — Encounter (HOSPITAL_COMMUNITY): Payer: Self-pay | Admitting: Gastroenterology

## 2023-03-11 ENCOUNTER — Encounter (HOSPITAL_COMMUNITY)
Admission: RE | Disposition: A | Discharge status: Home / Self Care | Payer: Self-pay | Source: Home / Self Care | Attending: Gastroenterology

## 2023-03-11 ENCOUNTER — Ambulatory Visit (HOSPITAL_COMMUNITY)
Admission: RE | Admit: 2023-03-11 | Discharge: 2023-03-11 | Disposition: A | Discharge status: Home / Self Care | Payer: Medicare Other | Attending: Gastroenterology | Admitting: Gastroenterology

## 2023-03-11 DIAGNOSIS — K5904 Chronic idiopathic constipation: Secondary | ICD-10-CM | POA: Insufficient documentation

## 2023-03-11 HISTORY — PX: ANAL RECTAL MANOMETRY: SHX6358

## 2023-03-11 SURGERY — MANOMETRY, ANORECTAL

## 2023-03-11 MED ORDER — FLEET ENEMA RE ENEM
ENEMA | RECTAL | Status: AC
  Filled 2023-03-11: qty 1

## 2023-03-11 NOTE — Progress Notes (Signed)
Anal rectal manometry performed per protocol without complications.  Patient tolerated well.  Balloon expulsion test performed per protocol without complications.  Patient tolerated well.  Unable to expell balloon after 3 minutes.

## 2023-03-13 ENCOUNTER — Encounter (HOSPITAL_COMMUNITY): Payer: Self-pay | Admitting: Gastroenterology

## 2023-03-16 DIAGNOSIS — N184 Chronic kidney disease, stage 4 (severe): Secondary | ICD-10-CM | POA: Diagnosis not present

## 2023-03-18 DIAGNOSIS — K5900 Constipation, unspecified: Secondary | ICD-10-CM | POA: Diagnosis not present

## 2023-03-18 DIAGNOSIS — I10 Essential (primary) hypertension: Secondary | ICD-10-CM | POA: Diagnosis not present

## 2023-03-18 DIAGNOSIS — H25811 Combined forms of age-related cataract, right eye: Secondary | ICD-10-CM | POA: Diagnosis not present

## 2023-03-18 DIAGNOSIS — R197 Diarrhea, unspecified: Secondary | ICD-10-CM | POA: Diagnosis not present

## 2023-03-18 DIAGNOSIS — Z961 Presence of intraocular lens: Secondary | ICD-10-CM | POA: Diagnosis not present

## 2023-03-18 DIAGNOSIS — E1122 Type 2 diabetes mellitus with diabetic chronic kidney disease: Secondary | ICD-10-CM | POA: Diagnosis not present

## 2023-03-20 DIAGNOSIS — Q438 Other specified congenital malformations of intestine: Secondary | ICD-10-CM | POA: Diagnosis not present

## 2023-03-20 DIAGNOSIS — K5909 Other constipation: Secondary | ICD-10-CM | POA: Diagnosis not present

## 2023-03-25 DIAGNOSIS — R801 Persistent proteinuria, unspecified: Secondary | ICD-10-CM | POA: Diagnosis not present

## 2023-03-25 DIAGNOSIS — I129 Hypertensive chronic kidney disease with stage 1 through stage 4 chronic kidney disease, or unspecified chronic kidney disease: Secondary | ICD-10-CM | POA: Diagnosis not present

## 2023-03-25 DIAGNOSIS — N184 Chronic kidney disease, stage 4 (severe): Secondary | ICD-10-CM | POA: Diagnosis not present

## 2023-03-25 DIAGNOSIS — E1122 Type 2 diabetes mellitus with diabetic chronic kidney disease: Secondary | ICD-10-CM | POA: Diagnosis not present

## 2023-03-25 DIAGNOSIS — E559 Vitamin D deficiency, unspecified: Secondary | ICD-10-CM | POA: Diagnosis not present

## 2023-03-25 DIAGNOSIS — Z87442 Personal history of urinary calculi: Secondary | ICD-10-CM | POA: Diagnosis not present

## 2023-04-14 DIAGNOSIS — H268 Other specified cataract: Secondary | ICD-10-CM | POA: Diagnosis not present

## 2023-04-14 DIAGNOSIS — H2511 Age-related nuclear cataract, right eye: Secondary | ICD-10-CM | POA: Diagnosis not present

## 2023-04-14 DIAGNOSIS — H25811 Combined forms of age-related cataract, right eye: Secondary | ICD-10-CM | POA: Diagnosis not present

## 2023-05-05 ENCOUNTER — Ambulatory Visit (HOSPITAL_COMMUNITY)
Admission: EM | Admit: 2023-05-05 | Discharge: 2023-05-05 | Disposition: A | Discharge status: Home / Self Care | Payer: Medicare Other | Attending: Internal Medicine | Admitting: Internal Medicine

## 2023-05-05 ENCOUNTER — Encounter (HOSPITAL_COMMUNITY): Payer: Self-pay | Admitting: Emergency Medicine

## 2023-05-05 ENCOUNTER — Ambulatory Visit (INDEPENDENT_AMBULATORY_CARE_PROVIDER_SITE_OTHER): Payer: Medicare Other

## 2023-05-05 DIAGNOSIS — J40 Bronchitis, not specified as acute or chronic: Secondary | ICD-10-CM

## 2023-05-05 DIAGNOSIS — R0989 Other specified symptoms and signs involving the circulatory and respiratory systems: Secondary | ICD-10-CM | POA: Diagnosis not present

## 2023-05-05 DIAGNOSIS — I771 Stricture of artery: Secondary | ICD-10-CM | POA: Diagnosis not present

## 2023-05-05 DIAGNOSIS — R058 Other specified cough: Secondary | ICD-10-CM | POA: Diagnosis not present

## 2023-05-05 DIAGNOSIS — R051 Acute cough: Secondary | ICD-10-CM

## 2023-05-05 DIAGNOSIS — I700 Atherosclerosis of aorta: Secondary | ICD-10-CM | POA: Diagnosis not present

## 2023-05-05 LAB — POC COVID19/FLU A&B COMBO
Covid Antigen, POC: NEGATIVE
Influenza A Antigen, POC: NEGATIVE
Influenza B Antigen, POC: NEGATIVE

## 2023-05-05 MED ORDER — CLARITHROMYCIN 500 MG PO TABS: 500.0000 mg | ORAL_TABLET | Freq: Two times a day (BID) | ORAL | 0 refills | Status: DC

## 2023-05-05 MED ORDER — ALBUTEROL SULFATE (2.5 MG/3ML) 0.083% IN NEBU
2.5000 mg | INHALATION_SOLUTION | Freq: Once | RESPIRATORY_TRACT | Status: AC
  Administered 2023-05-05: 2.5 mg via RESPIRATORY_TRACT

## 2023-05-05 MED ORDER — ALBUTEROL SULFATE (2.5 MG/3ML) 0.083% IN NEBU
INHALATION_SOLUTION | RESPIRATORY_TRACT | Status: AC
  Filled 2023-05-05: qty 3

## 2023-05-05 MED ORDER — CLARITHROMYCIN 500 MG PO TABS: 500.0000 mg | ORAL_TABLET | Freq: Two times a day (BID) | ORAL | 0 refills | Status: AC

## 2023-05-05 MED ORDER — ALBUTEROL SULFATE HFA 108 (90 BASE) MCG/ACT IN AERS: 2.0000 | INHALATION_SPRAY | RESPIRATORY_TRACT | 0 refills | Status: DC | PRN

## 2023-05-05 NOTE — Discharge Instructions (Signed)
Your chest xray will be read by the radiologist and I will call you with the results when done. In the mean time I sent a prescription to cover for bronchitis.

## 2023-05-05 NOTE — ED Provider Notes (Signed)
 [facility]    CSN: [id number] Arrival date & time: 05/05/23  0800      History   Chief Complaint Chief Complaint  Patient presents with   Cough    HPI Brandon Weaver is a 87 y.o. male who presents with onset of sneezing, chest congestion, cough, HA, scratchy throat x 2 days. Last night he was chilling and sweated all night. He did not check his temp, but believes he had a fever. Has pneumonia 09/'24 and wants to make sure he is OK. Has been mildly fatigued, but his appetite is normal. 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    GERD (gastroesophageal reflux disease)    hx of GERD - had reflux surgery (see surgical hx) per pt 05/31/19   Headache    hx of migraine   History of kidney stones    per pt 05/31/19 they told me to leave it alone until it gives me problems, so I still got them    Hypertension    Pneumonia    hx of pneumonia ~10years ago 05/31/19   Sleep apnea    does not tolerate cpap    Patient Active Problem List   Diagnosis Date Noted   S/P cervical spinal fusion 06/03/2019   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/2019   OSA (obstructive sleep apnea) 03/01/2014    Past Surgical History:  Procedure Laterality Date   ANAL RECTAL MANOMETRY N/A 03/11/2023   Procedure: ANO RECTAL MANOMETRY;  Surgeon: Saintclair Jasper, MD;  Location: [facility];  Service: Gastroenterology;  Laterality: N/A;   ANTERIOR CERVICAL DECOMP/DISCECTOMY FUSION N/A 06/03/2019   Procedure: ANTERIOR CERVICAL DECOMPRESSION FUSION CERVICAL THREE-FOUR;  Surgeon: Joshua Alm RAMAN, MD;  Location: [facility] OR;  Service: Neurosurgery;  Laterality: N/A;  anterior   DENTAL SURGERY     dental plates put in upper and lower ~7-8 years ago  per pt 05/31/19   EYE SURGERY Left    lasik eye surgery about 15-16 years ago 05/31/19   L shoulder surgery     muscle repair   reflux surgery     ROBOTIC ADRENALECTOMY Right 03/16/2019   Procedure: XI ROBOTIC ADRENALECTOMY, RENAL CYST DECORTICATION AND NODE DISSECTION;  Surgeon: Alvaro Hummer, MD;  Location: [facility];  Service: Urology;  Laterality: Right;  3 HRS   TONSILLECTOMY         Home Medications    Prior to Admission medications   Medication Sig Start Date End Date Taking? Authorizing Provider  albuterol  (VENTOLIN  HFA) 108 (90 Base) MCG/ACT inhaler Inhale 2 puffs into the lungs every 4 (four) hours as needed for wheezing or shortness of breath. 05/05/23  Yes Rodriguez-Southworth, Lithzy Bernard, PA-C  amLODipine  (NORVASC ) 5 MG tablet Take 5 mg by mouth daily. 12/17/18   [provider]  carboxymethylcellulose (REFRESH PLUS) 0.5 % SOLN Place 1-2 drops into both eyes 3 (three) times daily as needed (dry/irritated eyes.).    [provider]  Cholecalciferol (VITAMIN D3) 1.25 MG (50000 UT) CAPS Take 1 capsule by mouth once a week. 05/16/19   [provider]  clarithromycin  (BIAXIN ) 500 MG tablet Take 1 tablet (500 mg total)  by mouth 2 (two) times daily for 7 days. 05/05/23 05/12/23  Rodriguez-Southworth, Danielle Mink, PA-C  cyclobenzaprine (FLEXERIL) 10 MG tablet Take 10 mg by mouth 3 (three) times daily as needed for muscle spasms.  07/05/19   [provider]  diclofenac  Sodium (VOLTAREN ) 1 % GEL Apply 2 g topically 4 (four) times daily. 09/06/19   Couture, Cortni S, PA-C  FARXIGA 10 MG TABS tablet Take 10 mg by mouth daily. 06/14/21   [provider]  fluticasone  (FLONASE ) 50 MCG/ACT nasal spray Place 1 spray into both nostrils 2 (two) times daily. 05/28/22   Pearl Jenkins Lesches, NP  HUMULIN 70/30 (70-30) 100 UNIT/ML injection Inject 45 Units into the skin 2 (two) times daily. 01/24/19   [provider]  irbesartan  (AVAPRO ) 300 MG tablet Take 300 mg by  mouth daily.    [provider]  metoprolol succinate (TOPROL-XL) 25 MG 24 hr tablet Take 25 mg by mouth daily. 06/14/21   [provider]  montelukast  (SINGULAIR ) 10 MG tablet Take 1 tablet (10 mg total) by mouth at bedtime. 05/28/22   Pearl Jenkins Lesches, NP  [facility] ULTRA test strip 1 each by Other route in the morning and at bedtime. 07/15/19   [provider]  pravastatin (PRAVACHOL) 10 MG tablet Take 10 mg by mouth at bedtime. 07/08/21   [provider]  tamsulosin  (FLOMAX ) 0.4 MG CAPS capsule Take 0.4 mg by mouth at bedtime. 12/19/22   [provider]    Family History Family History  Problem Relation Age of Onset   COPD Mother    Heart disease Mother    COPD Brother    Cancer - Prostate Brother    Sleep apnea Son     Social History Social History   Tobacco Use   Smoking status: Former    Current packs/day: 0.00    Average packs/day: 0.5 packs/day for 30.0 years (15.0 ttl pk-yrs)    Types: Cigarettes    Start date: 05/05/1956    Quit date: 05/05/1986    Years since quitting: 37.0   Smokeless tobacco: Never  Vaping Use   Vaping status: Never Used  Substance Use Topics   Alcohol  use: Not Currently   Drug use: Never     Allergies   Laxative [bisacodyl]   Review of Systems Review of Systems As noted in HPI  Physical Exam Triage Vital Signs ED Triage Vitals  Encounter Vitals Group     BP 05/05/23 0822 114/71     Systolic BP Percentile --      Diastolic BP Percentile --      Pulse Rate 05/05/23 0822 92     Resp 05/05/23 0822 18     Temp 05/05/23 0822 99 F (37.2 C)     Temp Source 05/05/23 0822 Oral     SpO2 05/05/23 0822 97 %     Weight --      Height --      Head Circumference --      Peak Flow --      Pain Score 05/05/23 0821 0     Pain Loc --      Pain Education --      Exclude from Growth Chart --    No data found.  Updated Vital Signs BP 114/71 (BP Location: Right Arm)   Pulse 92   Temp 99 F (37.2 C)  (Oral)   Resp 18   SpO2 97%   Visual Acuity Right Eye Distance:   Left  Eye Distance:   Bilateral Distance:    Right Eye Near:   Left Eye Near:    Bilateral Near:     Physical Exam Physical Exam Constitutional:      General: He is not in acute distress.    Appearance: He is not toxic-appearing.  HENT:     Head: Normocephalic.     Right Ear: Tympanic membrane, ear canal and external ear normal.     Left Ear: Ear canal and external ear normal.     Nose: Nose normal.     Mouth/Throat:     Mouth: Mucous membranes are moist.     Pharynx: Oropharynx is clear.  Eyes:     General: No scleral icterus.    Conjunctiva/sclera: Conjunctivae normal.  Cardiovascular:     Rate and Rhythm: Normal rate and regular rhythm.     Heart sounds: No murmur heard.   Pulmonary:     Effort: Pulmonary effort is normal. No respiratory distress.     Breath sounds: Wheezing present.     Comments: Has auditory wheezing Musculoskeletal:        General: Normal range of motion.     Cervical back: Neck supple.  Lymphadenopathy:     Cervical: No cervical adenopathy.  Skin:    General: Skin is warm and dry.     Findings: No rash.  Neurological:     Mental Status: He is alert and oriented to person, place, and time.     Gait: Gait normal.  Psychiatric:        Mood and Affect: Mood normal.        Behavior: Behavior normal.        Thought Content: Thought content normal.        Judgment: Judgment normal.    UC Treatments / Results  Labs (all labs ordered are listed, but only abnormal results are displayed) Labs Reviewed  POC COVID19/FLU A&B COMBO    EKG   Radiology DG Chest 2 View Result Date: 05/05/2023 CLINICAL DATA:  Productive cough, sneezing, congestion. EXAM: CHEST - 2 VIEW COMPARISON:  Chest radiographs 01/28/2023, 07/10/2021 FINDINGS: Cardiac silhouette is normal in size. Tortuous descending thoracic aorta is similar to prior. Mild-to-moderate atherosclerotic calcifications within  the thoracic aorta, unchanged. No focal airspace opacity. No pulmonary edema, pleural effusion, or pneumothorax. Mild dextrocurvature of the mid to upper thoracic spine. Moderate multilevel degenerative disc changes of the thoracic spine. IMPRESSION: No active cardiopulmonary disease. Electronically Signed   By: Tanda Lyons M.D.   On: 05/05/2023 08:46    Procedures Procedures (including critical care time)  Medications Ordered in UC Medications  albuterol  (PROVENTIL ) (2.5 MG/3ML) 0.083% nebulizer solution 2.5 mg (2.5 mg Nebulization Given 05/05/23 0850)    Initial Impression / Assessment and Plan / UC Course  I have reviewed the triage vital signs and the nursing notes.  Pertinent labs & imaging results that were available during my care of the patient were reviewed by me and considered in my medical decision making (see chart for details).  Albuterol  neb ordered and lungs seemed to clear his wheezing a little  Acute bronchitis  I placed him on Biaxen and Ventolin  inhaler as noted.      Final Clinical Impressions(s) / UC Diagnoses   Final diagnoses:  Acute cough  Bronchitis     Discharge Instructions      Your chest xray will be read by the radiologist and I will call you with the results when done. In the mean  time I sent a prescription to cover for bronchitis.      ED Prescriptions     Medication Sig Dispense Auth. Provider   clarithromycin  (BIAXIN ) 500 MG tablet  (Status: Discontinued) Take 1 tablet (500 mg total) by mouth 2 (two) times daily for 7 days. 14 tablet Rodriguez-Southworth, Kyra, PA-C   albuterol  (VENTOLIN  HFA) 108 (90 Base) MCG/ACT inhaler Inhale 2 puffs into the lungs every 4 (four) hours as needed for wheezing or shortness of breath. 17 g Rodriguez-Southworth, Avett Reineck, PA-C   clarithromycin  (BIAXIN ) 500 MG tablet Take 1 tablet (500 mg total) by mouth 2 (two) times daily for 7 days. 14 tablet Rodriguez-Southworth, Kyra, PA-C      PDMP not  reviewed this encounter.   Lindi Kyra, [place] 05/05/23 [phone]

## 2023-05-05 NOTE — ED Triage Notes (Signed)
Pt reports has productive cough, sneezing, congestion, headaches, sore throat that started Sunday. Reports had PNA couple months ago and wants to make sure is okay. Home covid test was negative.

## 2023-06-15 DIAGNOSIS — N184 Chronic kidney disease, stage 4 (severe): Secondary | ICD-10-CM | POA: Diagnosis not present

## 2023-06-16 DIAGNOSIS — I700 Atherosclerosis of aorta: Secondary | ICD-10-CM | POA: Diagnosis not present

## 2023-06-16 DIAGNOSIS — N1832 Chronic kidney disease, stage 3b: Secondary | ICD-10-CM | POA: Diagnosis not present

## 2023-06-16 DIAGNOSIS — E1122 Type 2 diabetes mellitus with diabetic chronic kidney disease: Secondary | ICD-10-CM | POA: Diagnosis not present

## 2023-06-16 DIAGNOSIS — I10 Essential (primary) hypertension: Secondary | ICD-10-CM | POA: Diagnosis not present

## 2023-06-16 DIAGNOSIS — E7800 Pure hypercholesterolemia, unspecified: Secondary | ICD-10-CM | POA: Diagnosis not present

## 2023-06-22 ENCOUNTER — Other Ambulatory Visit: Payer: Self-pay | Admitting: Internal Medicine

## 2023-06-22 ENCOUNTER — Ambulatory Visit
Admission: RE | Admit: 2023-06-22 | Discharge: 2023-06-22 | Disposition: A | Discharge status: Home / Self Care | Payer: Medicare Other | Source: Ambulatory Visit | Attending: Internal Medicine | Admitting: Internal Medicine

## 2023-06-22 DIAGNOSIS — R109 Unspecified abdominal pain: Secondary | ICD-10-CM | POA: Diagnosis not present

## 2023-06-22 DIAGNOSIS — R140 Abdominal distension (gaseous): Secondary | ICD-10-CM | POA: Diagnosis not present

## 2023-06-22 DIAGNOSIS — K5900 Constipation, unspecified: Secondary | ICD-10-CM

## 2023-06-22 DIAGNOSIS — N200 Calculus of kidney: Secondary | ICD-10-CM | POA: Diagnosis not present

## 2023-07-01 ENCOUNTER — Other Ambulatory Visit (HOSPITAL_COMMUNITY): Payer: Self-pay | Admitting: Internal Medicine

## 2023-07-01 DIAGNOSIS — R109 Unspecified abdominal pain: Secondary | ICD-10-CM | POA: Diagnosis not present

## 2023-07-01 DIAGNOSIS — K5900 Constipation, unspecified: Secondary | ICD-10-CM | POA: Diagnosis not present

## 2023-07-02 ENCOUNTER — Emergency Department (HOSPITAL_COMMUNITY)
Admission: EM | Admit: 2023-07-02 | Discharge: 2023-07-03 | Disposition: A | Discharge status: Home / Self Care | Payer: Medicare Other | Attending: Emergency Medicine | Admitting: Emergency Medicine

## 2023-07-02 ENCOUNTER — Ambulatory Visit (HOSPITAL_COMMUNITY)
Admission: RE | Admit: 2023-07-02 | Discharge: 2023-07-02 | Disposition: A | Discharge status: Home / Self Care | Payer: Medicare Other | Source: Ambulatory Visit | Attending: Internal Medicine | Admitting: Internal Medicine

## 2023-07-02 ENCOUNTER — Encounter (HOSPITAL_COMMUNITY): Payer: Self-pay | Admitting: *Deleted

## 2023-07-02 ENCOUNTER — Other Ambulatory Visit: Payer: Self-pay

## 2023-07-02 DIAGNOSIS — E1122 Type 2 diabetes mellitus with diabetic chronic kidney disease: Secondary | ICD-10-CM | POA: Diagnosis not present

## 2023-07-02 DIAGNOSIS — N281 Cyst of kidney, acquired: Secondary | ICD-10-CM | POA: Diagnosis not present

## 2023-07-02 DIAGNOSIS — I129 Hypertensive chronic kidney disease with stage 1 through stage 4 chronic kidney disease, or unspecified chronic kidney disease: Secondary | ICD-10-CM | POA: Diagnosis not present

## 2023-07-02 DIAGNOSIS — K5900 Constipation, unspecified: Secondary | ICD-10-CM | POA: Insufficient documentation

## 2023-07-02 DIAGNOSIS — Z79899 Other long term (current) drug therapy: Secondary | ICD-10-CM | POA: Diagnosis not present

## 2023-07-02 DIAGNOSIS — K6389 Other specified diseases of intestine: Secondary | ICD-10-CM | POA: Diagnosis not present

## 2023-07-02 DIAGNOSIS — R109 Unspecified abdominal pain: Secondary | ICD-10-CM | POA: Diagnosis present

## 2023-07-02 DIAGNOSIS — K8020 Calculus of gallbladder without cholecystitis without obstruction: Secondary | ICD-10-CM | POA: Diagnosis not present

## 2023-07-02 DIAGNOSIS — N200 Calculus of kidney: Secondary | ICD-10-CM | POA: Diagnosis not present

## 2023-07-02 DIAGNOSIS — Z794 Long term (current) use of insulin: Secondary | ICD-10-CM | POA: Diagnosis not present

## 2023-07-02 DIAGNOSIS — N189 Chronic kidney disease, unspecified: Secondary | ICD-10-CM | POA: Diagnosis not present

## 2023-07-02 LAB — CBC WITH DIFFERENTIAL/PLATELET
Abs Immature Granulocytes: 0.02 10*3/uL (ref 0.00–0.07)
Basophils Absolute: 0 10*3/uL (ref 0.0–0.1)
Basophils Relative: 0 %
Eosinophils Absolute: 0.1 10*3/uL (ref 0.0–0.5)
Eosinophils Relative: 3 %
HCT: 40 % (ref 39.0–52.0)
Hemoglobin: 13.2 g/dL (ref 13.0–17.0)
Immature Granulocytes: 0 %
Lymphocytes Relative: 24 %
Lymphs Abs: 1.2 10*3/uL (ref 0.7–4.0)
MCH: 28.6 pg (ref 26.0–34.0)
MCHC: 33 g/dL (ref 30.0–36.0)
MCV: 86.6 fL (ref 80.0–100.0)
Monocytes Absolute: 0.7 10*3/uL (ref 0.1–1.0)
Monocytes Relative: 14 %
Neutro Abs: 3.1 10*3/uL (ref 1.7–7.7)
Neutrophils Relative %: 59 %
Platelets: 207 10*3/uL (ref 150–400)
RBC: 4.62 MIL/uL (ref 4.22–5.81)
RDW: 14.5 % (ref 11.5–15.5)
WBC: 5.2 10*3/uL (ref 4.0–10.5)
nRBC: 0 % (ref 0.0–0.2)

## 2023-07-02 LAB — COMPREHENSIVE METABOLIC PANEL
ALT: 16 U/L (ref 0–44)
AST: 19 U/L (ref 15–41)
Albumin: 3.7 g/dL (ref 3.5–5.0)
Alkaline Phosphatase: 50 U/L (ref 38–126)
Anion gap: 11 (ref 5–15)
BUN: 39 mg/dL — ABNORMAL HIGH (ref 8–23)
CO2: 23 mmol/L (ref 22–32)
Calcium: 9.9 mg/dL (ref 8.9–10.3)
Chloride: 102 mmol/L (ref 98–111)
Creatinine, Ser: 2.77 mg/dL — ABNORMAL HIGH (ref 0.61–1.24)
GFR, Estimated: 22 mL/min — ABNORMAL LOW (ref >60)
Glucose, Bld: 95 mg/dL (ref 70–99)
Potassium: 4.8 mmol/L (ref 3.5–5.1)
Sodium: 136 mmol/L (ref 135–145)
Total Bilirubin: 0.5 mg/dL (ref 0.0–1.2)
Total Protein: 7 g/dL (ref 6.5–8.1)

## 2023-07-02 LAB — URINALYSIS, ROUTINE W REFLEX MICROSCOPIC
Bilirubin Urine: NEGATIVE
Glucose, UA: NEGATIVE mg/dL
Hgb urine dipstick: NEGATIVE
Ketones, ur: NEGATIVE mg/dL
Leukocytes,Ua: NEGATIVE
Nitrite: NEGATIVE
Protein, ur: 100 mg/dL — AB
Specific Gravity, Urine: 1.017 (ref 1.005–1.030)
pH: 5 (ref 5.0–8.0)

## 2023-07-02 LAB — LIPASE, BLOOD: Lipase: 76 U/L — ABNORMAL HIGH (ref 11–51)

## 2023-07-02 LAB — LACTIC ACID, PLASMA: Lactic Acid, Venous: 1.4 mmol/L (ref 0.5–1.9)

## 2023-07-02 NOTE — ED Provider Triage Note (Signed)
 Emergency Medicine Provider Triage Evaluation Note  Brandon Weaver , a 88 y.o. male  was evaluated in triage.  Pt complains of abnormal CT scan.  States that he has been dealing with abdominal bloating, cramping sensation for the past 3 to 5 days.  Does report having very small hard stool passed just before I evaluated him.  States he took pain medicine is not having abdominal pain currently.  Denies any nausea, vomiting..  Review of Systems  Positive: See above Negative:   Physical Exam  BP 131/72 (BP Location: Right Arm)   Pulse 83   Temp 98.1 F (36.7 C)   Resp 20   SpO2 98%  Gen:   Awake, no distress   Resp:  Normal effort  MSK:   Moves extremities without difficulty  Other:    Medical Decision Making  Medically screening exam initiated at 6:54 PM.  Appropriate orders placed.  Brandon Weaver was informed that the remainder of the evaluation will be completed by another provider, this initial triage assessment does not replace that evaluation, and the importance of remaining in the ED until their evaluation is complete.     Brandon Weaver, [place] 07/02/23 [phone]

## 2023-07-02 NOTE — ED Triage Notes (Signed)
 Pt states that he had an outpatient CT and was called and told to come here as it showed a "bowel obstruction".  Pt has had abdominal pain and bloating x3 days, no fever with this. No vomiting

## 2023-07-03 ENCOUNTER — Emergency Department (HOSPITAL_COMMUNITY): Payer: Medicare Other

## 2023-07-03 DIAGNOSIS — N281 Cyst of kidney, acquired: Secondary | ICD-10-CM | POA: Diagnosis not present

## 2023-07-03 DIAGNOSIS — K8020 Calculus of gallbladder without cholecystitis without obstruction: Secondary | ICD-10-CM | POA: Diagnosis not present

## 2023-07-03 DIAGNOSIS — N200 Calculus of kidney: Secondary | ICD-10-CM | POA: Diagnosis not present

## 2023-07-03 LAB — LACTIC ACID, PLASMA: Lactic Acid, Venous: 1.2 mmol/L (ref 0.5–1.9)

## 2023-07-03 NOTE — ED Notes (Signed)
 Completed oral contrast/ CT aware

## 2023-07-03 NOTE — Discharge Instructions (Signed)
 Return if any problems.

## 2023-07-03 NOTE — ED Provider Notes (Signed)
 Patient's care assumed by me at 6:30 AM.  Patient is pending CT scan of his abdomen to evaluate for bowel obstruction.  Patient has a past medical history of a redundant sigmoid colon.  Patient had a CT done by his primary care doctor and there was concern for ileus versus bowel obstruction.  Patient was sent here for further imaging.  CT scan shows no acute abnormality.  Patient is reexamined.  Abdomen is soft nontender no guarding no rebound.  Of note patient has a history of bradycardia.  Patient's heart rate is in the 70s at the time of my evaluation.   Elson Areas, [place] 07/03/23 1008    Durwin Glaze, MD 07/03/23 [phone]

## 2023-07-03 NOTE — ED Provider Notes (Signed)
 [place] EMERGENCY DEPARTMENT AT [facility] Provider Note   CSN: [id number] Arrival date & time: 07/02/23  1746     History  Chief Complaint  Patient presents with   Abdominal Pain    Bowel obstruction    Brandon Weaver is a 88 y.o. male.  Patient with past medical history significant for redundant sigmoid colon, hypertension, type II DM, CKD, GERD presents to the emergency department complaining of abdominal pain and bloating for 3 days with no bowel movements.  Patient endorses occasional episodes of flatus.  He denies nausea/vomiting.  He states he has not been eating due to feeling full but is able to drink fluids without difficulty.  He denies fever, shortness of breath, chest pain.  The patient had an outpatient CT abdomen pelvis without contrast yesterday with findings showing multiple fluid-filled, mildly dilated loops of small bowel measuring up to 3.4 cm in diameter.  Radiology found this favored ileus versus SBO.  Patient at the emergency department for further evaluation.   Abdominal Pain      Home Medications Prior to Admission medications   Medication Sig Start Date End Date Taking? Authorizing Provider  albuterol (VENTOLIN HFA) 108 (90 Base) MCG/ACT inhaler Inhale 2 puffs into the lungs every 4 (four) hours as needed for wheezing or shortness of breath. 05/05/23   Rodriguez-Southworth, Nettie Elm, PA-C  amLODipine (NORVASC) 5 MG tablet Take 5 mg by mouth daily. 12/17/18   [provider]  carboxymethylcellulose (REFRESH PLUS) 0.5 % SOLN Place 1-2 drops into both eyes 3 (three) times daily as needed (dry/irritated eyes.).    [provider]  Cholecalciferol (VITAMIN D3) 1.25 MG (50000 UT) CAPS Take 1 capsule by mouth once a week. 05/16/19   [provider]  cyclobenzaprine (FLEXERIL) 10 MG tablet Take 10 mg by mouth 3 (three) times daily as needed for muscle spasms.  07/05/19   [provider]  diclofenac Sodium (VOLTAREN) 1 %  GEL Apply 2 g topically 4 (four) times daily. 09/06/19   Couture, Cortni S, PA-C  FARXIGA 10 MG TABS tablet Take 10 mg by mouth daily. 06/14/21   [provider]  fluticasone (FLONASE) 50 MCG/ACT nasal spray Place 1 spray into both nostrils 2 (two) times daily. 05/28/22   Sudie Grumbling, NP  HUMULIN 70/30 (70-30) 100 UNIT/ML injection Inject 45 Units into the skin 2 (two) times daily. 01/24/19   [provider]  irbesartan (AVAPRO) 300 MG tablet Take 300 mg by mouth daily.    [provider]  metoprolol succinate (TOPROL-XL) 25 MG 24 hr tablet Take 25 mg by mouth daily. 06/14/21   [provider]  montelukast (SINGULAIR) 10 MG tablet Take 1 tablet (10 mg total) by mouth at bedtime. 05/28/22   Sudie Grumbling, NP  [facility] ULTRA test strip 1 each by Other route in the morning and at bedtime. 07/15/19   [provider]  pravastatin (PRAVACHOL) 10 MG tablet Take 10 mg by mouth at bedtime. 07/08/21   [provider]  tamsulosin (FLOMAX) 0.4 MG CAPS capsule Take 0.4 mg by mouth at bedtime. 12/19/22   [provider]      Allergies    Laxative [bisacodyl]    Review of Systems   Review of Systems  Gastrointestinal:  Positive for abdominal pain.    Physical Exam Updated Vital Signs BP 114/62   Pulse 60   Temp 98 F (36.7 C)   Resp 15   SpO2 92%  Physical  Exam Vitals and nursing note reviewed.  Constitutional:      General: He is not in acute distress.    Appearance: He is well-developed.  HENT:     Head: Normocephalic and atraumatic.  Eyes:     Conjunctiva/sclera: Conjunctivae normal.  Cardiovascular:     Rate and Rhythm: Normal rate and regular rhythm.  Pulmonary:     Effort: Pulmonary effort is normal. No respiratory distress.     Breath sounds: Normal breath sounds.  Abdominal:     General: There is distension.     Palpations: Abdomen is soft.     Comments: Abdomen does appear distended with no significant tenderness.   Musculoskeletal:        General: No swelling.     Cervical back: Neck supple.  Skin:    General: Skin is warm and dry.     Capillary Refill: Capillary refill takes less than 2 seconds.  Neurological:     Mental Status: He is alert.  Psychiatric:        Mood and Affect: Mood normal.     ED Results / Procedures / Treatments   Labs (all labs ordered are listed, but only abnormal results are displayed) Labs Reviewed  COMPREHENSIVE METABOLIC PANEL - Abnormal; Notable for the following components:      Result Value   BUN 39 (*)    Creatinine, Ser 2.77 (*)    GFR, Estimated 22 (*)    All other components within normal limits  LIPASE, BLOOD - Abnormal; Notable for the following components:   Lipase 76 (*)    All other components within normal limits  URINALYSIS, ROUTINE W REFLEX MICROSCOPIC - Abnormal; Notable for the following components:   Protein, ur 100 (*)    Bacteria, UA RARE (*)    All other components within normal limits  CBC WITH DIFFERENTIAL/PLATELET  LACTIC ACID, PLASMA  LACTIC ACID, PLASMA    EKG None  Radiology CT ABDOMEN PELVIS WO CONTRAST Result Date: 07/02/2023 CLINICAL DATA:  Abdominal pain EXAM: CT ABDOMEN AND PELVIS WITHOUT CONTRAST TECHNIQUE: Multidetector CT imaging of the abdomen and pelvis was performed following the standard protocol without IV contrast. RADIATION DOSE REDUCTION: This exam was performed according to the departmental dose-optimization program which includes automated exposure control, adjustment of the mA and/or kV according to patient size and/or use of iterative reconstruction technique. COMPARISON:  01/02/2023 FINDINGS: Lower chest: No acute abnormality. Hepatobiliary: Unremarkable unenhanced appearance of the liver. Cholelithiasis. No pericholecystic inflammatory changes by CT. No biliary dilatation. Pancreas: Unremarkable. No pancreatic ductal dilatation or surrounding inflammatory changes. Spleen: Normal in size without focal  abnormality. Adrenals/Urinary Tract: Surgically absent right adrenal gland. Chronically thickened left adrenal gland. Unchanged bilateral renal cysts. 18 x 12 mm nonobstructing stone in the lower pole of the right kidney. No left-sided renal calculi. No hydronephrosis. No ureteral calculi. Circumferential urinary bladder wall thickening. Stomach/Bowel: Stomach within normal limits. Multiple fluid-filled, mildly dilated loops of small bowel measuring up to 3.4 cm in diameter. No abrupt transition point is identified. Colon is moderately distended with air and stool. No focal bowel wall thickening or inflammatory changes. Vascular/Lymphatic: Aortic atherosclerosis. No enlarged abdominal or pelvic lymph nodes. Reproductive: Marked prostatomegaly. Other: No free air or free fluid. Musculoskeletal: No acute or significant osseous findings. IMPRESSION: 1. Multiple fluid-filled, mildly dilated loops of small bowel measuring up to 3.4 cm in diameter. No abrupt transition point is identified. There is also moderate colonic distension. Findings are favored to represent ileus. 2. Marked  prostatomegaly. Circumferential urinary bladder wall thickening, which may be secondary to chronic bladder outlet obstruction. Correlate with urinalysis to exclude cystitis. 3. Nonobstructing right renal calculus measuring 18 x 12 mm. 4. Cholelithiasis. 5. Aortic atherosclerosis. Electronically Signed   By: Duanne Guess D.O.   On: 07/02/2023 15:37    Procedures Procedures    Medications Ordered in ED Medications - No data to display  ED Course/ Medical Decision Making/ A&P                                 Medical Decision Making Amount and/or Complexity of Data Reviewed Radiology: ordered.   This patient presents to the ED for concern of constipation, abdominal bloating, this involves an extensive number of treatment options, and is a complaint that carries with it a high risk of complications and morbidity.  The  differential diagnosis includes SBO, ileus, fecal impaction, constipation, others   Co morbidities that complicate the patient evaluation  Redundant sigmoid colon   Additional history obtained:  Additional history obtained from family bedside External records from outside source obtained and reviewed including CT scan from yesterday   Lab Tests:  I Ordered, and personally interpreted labs.  The pertinent results include: Creatinine 2.77 at baseline, lipase 76, unremarkable CBC, grossly unremarkable UA   Imaging Studies ordered:  I ordered imaging studies including CT abdomen pelvis with oral contrast Patient was taken to the CT scanner prior to administration of oral contrast.  Rad tech realized and sent patient back to room with oral contrast for consumption prior to scan   Problem List / ED Course / Critical interventions / Medication management   I ordered medication including oral contrast for CT scan I have reviewed the patients home medicines and have made adjustments as needed   Social Determinants of Health:  Social determinants of health were considered but were not a significant factor during this encounter   Test / Admission - Considered:  Patient care being transferred to Langston Masker, PA-C at shift handoff.  Disposition pending results of repeat CT scan.  If presentation continues to be consistent with ileus patient may likely discharge home with follow-up with GI and primary care.  If CT shows evidence of SBO patient will need admission for further management.         Final Clinical Impression(s) / ED Diagnoses Final diagnoses:  None    Rx / DC Orders ED Discharge Orders     None         Pamala Duffel 07/03/23 1610    Marily Memos, MD 07/04/23 [phone]

## 2023-07-03 NOTE — ED Notes (Signed)
Drinking oral contrast 

## 2023-07-03 NOTE — ED Notes (Signed)
 Patient only had left hearing aid in upon arrival and has foley on arrival

## 2023-07-03 NOTE — ED Notes (Signed)
 Transported to CT

## 2023-07-03 NOTE — ED Notes (Signed)
 Patient transported to CT

## 2023-07-10 DIAGNOSIS — Q438 Other specified congenital malformations of intestine: Secondary | ICD-10-CM | POA: Diagnosis not present

## 2023-07-10 DIAGNOSIS — K567 Ileus, unspecified: Secondary | ICD-10-CM | POA: Diagnosis not present

## 2023-07-10 DIAGNOSIS — K5900 Constipation, unspecified: Secondary | ICD-10-CM | POA: Diagnosis not present

## 2023-08-07 DIAGNOSIS — N184 Chronic kidney disease, stage 4 (severe): Secondary | ICD-10-CM | POA: Diagnosis not present

## 2023-08-07 DIAGNOSIS — I129 Hypertensive chronic kidney disease with stage 1 through stage 4 chronic kidney disease, or unspecified chronic kidney disease: Secondary | ICD-10-CM | POA: Diagnosis not present

## 2023-08-07 DIAGNOSIS — E1122 Type 2 diabetes mellitus with diabetic chronic kidney disease: Secondary | ICD-10-CM | POA: Diagnosis not present

## 2023-08-07 DIAGNOSIS — R801 Persistent proteinuria, unspecified: Secondary | ICD-10-CM | POA: Diagnosis not present

## 2023-08-07 DIAGNOSIS — E559 Vitamin D deficiency, unspecified: Secondary | ICD-10-CM | POA: Diagnosis not present

## 2023-08-07 DIAGNOSIS — E875 Hyperkalemia: Secondary | ICD-10-CM | POA: Diagnosis not present

## 2023-08-07 DIAGNOSIS — Z87442 Personal history of urinary calculi: Secondary | ICD-10-CM | POA: Diagnosis not present

## 2023-08-12 DIAGNOSIS — H04123 Dry eye syndrome of bilateral lacrimal glands: Secondary | ICD-10-CM | POA: Diagnosis not present

## 2023-09-16 DIAGNOSIS — H524 Presbyopia: Secondary | ICD-10-CM | POA: Diagnosis not present

## 2023-09-16 DIAGNOSIS — H04123 Dry eye syndrome of bilateral lacrimal glands: Secondary | ICD-10-CM | POA: Diagnosis not present

## 2023-10-26 DIAGNOSIS — N184 Chronic kidney disease, stage 4 (severe): Secondary | ICD-10-CM | POA: Diagnosis not present

## 2023-11-02 DIAGNOSIS — E7800 Pure hypercholesterolemia, unspecified: Secondary | ICD-10-CM | POA: Diagnosis not present

## 2023-11-02 DIAGNOSIS — N184 Chronic kidney disease, stage 4 (severe): Secondary | ICD-10-CM | POA: Diagnosis not present

## 2023-11-04 DIAGNOSIS — N184 Chronic kidney disease, stage 4 (severe): Secondary | ICD-10-CM | POA: Diagnosis not present

## 2023-11-04 DIAGNOSIS — R801 Persistent proteinuria, unspecified: Secondary | ICD-10-CM | POA: Diagnosis not present

## 2023-11-04 DIAGNOSIS — E1122 Type 2 diabetes mellitus with diabetic chronic kidney disease: Secondary | ICD-10-CM | POA: Diagnosis not present

## 2023-11-04 DIAGNOSIS — E872 Acidosis, unspecified: Secondary | ICD-10-CM | POA: Diagnosis not present

## 2023-11-04 DIAGNOSIS — E875 Hyperkalemia: Secondary | ICD-10-CM | POA: Diagnosis not present

## 2023-11-04 DIAGNOSIS — I129 Hypertensive chronic kidney disease with stage 1 through stage 4 chronic kidney disease, or unspecified chronic kidney disease: Secondary | ICD-10-CM | POA: Diagnosis not present

## 2023-11-10 DIAGNOSIS — N184 Chronic kidney disease, stage 4 (severe): Secondary | ICD-10-CM | POA: Diagnosis not present

## 2023-11-10 DIAGNOSIS — E872 Acidosis, unspecified: Secondary | ICD-10-CM | POA: Diagnosis not present

## 2023-11-10 DIAGNOSIS — R059 Cough, unspecified: Secondary | ICD-10-CM | POA: Diagnosis not present

## 2023-11-11 DIAGNOSIS — H40023 Open angle with borderline findings, high risk, bilateral: Secondary | ICD-10-CM | POA: Diagnosis not present

## 2023-11-11 DIAGNOSIS — H26493 Other secondary cataract, bilateral: Secondary | ICD-10-CM | POA: Diagnosis not present

## 2023-11-11 DIAGNOSIS — E119 Type 2 diabetes mellitus without complications: Secondary | ICD-10-CM | POA: Diagnosis not present

## 2023-11-11 DIAGNOSIS — Z794 Long term (current) use of insulin: Secondary | ICD-10-CM | POA: Diagnosis not present

## 2023-11-11 DIAGNOSIS — H524 Presbyopia: Secondary | ICD-10-CM | POA: Diagnosis not present

## 2023-11-30 DIAGNOSIS — K219 Gastro-esophageal reflux disease without esophagitis: Secondary | ICD-10-CM | POA: Diagnosis not present

## 2023-11-30 DIAGNOSIS — R059 Cough, unspecified: Secondary | ICD-10-CM | POA: Diagnosis not present

## 2023-12-03 DIAGNOSIS — E7800 Pure hypercholesterolemia, unspecified: Secondary | ICD-10-CM | POA: Diagnosis not present

## 2023-12-03 DIAGNOSIS — N184 Chronic kidney disease, stage 4 (severe): Secondary | ICD-10-CM | POA: Diagnosis not present

## 2023-12-26 ENCOUNTER — Emergency Department (HOSPITAL_COMMUNITY)

## 2023-12-26 ENCOUNTER — Other Ambulatory Visit: Payer: Self-pay

## 2023-12-26 ENCOUNTER — Emergency Department (HOSPITAL_COMMUNITY)
Admission: EM | Admit: 2023-12-26 | Discharge: 2023-12-26 | Disposition: A | Discharge status: Home / Self Care | Attending: Emergency Medicine | Admitting: Emergency Medicine

## 2023-12-26 ENCOUNTER — Encounter (HOSPITAL_COMMUNITY): Payer: Self-pay

## 2023-12-26 DIAGNOSIS — M5030 Other cervical disc degeneration, unspecified cervical region: Secondary | ICD-10-CM | POA: Diagnosis not present

## 2023-12-26 DIAGNOSIS — S0083XA Contusion of other part of head, initial encounter: Secondary | ICD-10-CM

## 2023-12-26 DIAGNOSIS — S0101XA Laceration without foreign body of scalp, initial encounter: Secondary | ICD-10-CM | POA: Diagnosis not present

## 2023-12-26 DIAGNOSIS — W19XXXA Unspecified fall, initial encounter: Secondary | ICD-10-CM

## 2023-12-26 DIAGNOSIS — S199XXA Unspecified injury of neck, initial encounter: Secondary | ICD-10-CM | POA: Diagnosis not present

## 2023-12-26 DIAGNOSIS — S0990XA Unspecified injury of head, initial encounter: Secondary | ICD-10-CM | POA: Diagnosis not present

## 2023-12-26 DIAGNOSIS — R519 Headache, unspecified: Secondary | ICD-10-CM | POA: Diagnosis present

## 2023-12-26 DIAGNOSIS — M47812 Spondylosis without myelopathy or radiculopathy, cervical region: Secondary | ICD-10-CM | POA: Diagnosis not present

## 2023-12-26 DIAGNOSIS — Z981 Arthrodesis status: Secondary | ICD-10-CM | POA: Diagnosis not present

## 2023-12-26 DIAGNOSIS — E119 Type 2 diabetes mellitus without complications: Secondary | ICD-10-CM | POA: Diagnosis not present

## 2023-12-26 DIAGNOSIS — W1809XA Striking against other object with subsequent fall, initial encounter: Secondary | ICD-10-CM | POA: Insufficient documentation

## 2023-12-26 DIAGNOSIS — S0181XA Laceration without foreign body of other part of head, initial encounter: Secondary | ICD-10-CM | POA: Diagnosis not present

## 2023-12-26 DIAGNOSIS — R55 Syncope and collapse: Secondary | ICD-10-CM | POA: Diagnosis not present

## 2023-12-26 LAB — CBG MONITORING, ED: Glucose-Capillary: 90 mg/dL (ref 70–99)

## 2023-12-26 LAB — CBC
HCT: 41.3 % (ref 39.0–52.0)
Hemoglobin: 13.6 g/dL (ref 13.0–17.0)
MCH: 29.4 pg (ref 26.0–34.0)
MCHC: 32.9 g/dL (ref 30.0–36.0)
MCV: 89.2 fL (ref 80.0–100.0)
Platelets: 182 K/uL (ref 150–400)
RBC: 4.63 MIL/uL (ref 4.22–5.81)
RDW: 12.9 % (ref 11.5–15.5)
WBC: 3.2 K/uL — ABNORMAL LOW (ref 4.0–10.5)
nRBC: 0 % (ref 0.0–0.2)

## 2023-12-26 LAB — BASIC METABOLIC PANEL WITH GFR
Anion gap: 9 (ref 5–15)
BUN: 48 mg/dL — ABNORMAL HIGH (ref 8–23)
CO2: 24 mmol/L (ref 22–32)
Calcium: 9.8 mg/dL (ref 8.9–10.3)
Chloride: 102 mmol/L (ref 98–111)
Creatinine, Ser: 2.29 mg/dL — ABNORMAL HIGH (ref 0.61–1.24)
GFR, Estimated: 27 mL/min — ABNORMAL LOW (ref >60)
Glucose, Bld: 92 mg/dL (ref 70–99)
Potassium: 4.6 mmol/L (ref 3.5–5.1)
Sodium: 135 mmol/L (ref 135–145)

## 2023-12-26 LAB — TROPONIN I (HIGH SENSITIVITY): Troponin I (High Sensitivity): 9 ng/L (ref <18)

## 2023-12-26 MED ORDER — LIDOCAINE-EPINEPHRINE (PF) 2 %-1:200000 IJ SOLN: 10.0000 mL | Freq: Once | INTRAMUSCULAR | Status: DC

## 2023-12-26 MED ORDER — LIDOCAINE-EPINEPHRINE-TETRACAINE (LET) TOPICAL GEL: 3.0000 mL | Freq: Once | TOPICAL | Status: DC

## 2023-12-26 NOTE — ED Notes (Addendum)
 Pt became dizzy and lightheaded prior to taking a fall. Pt denies LOC. Last event was 4 months ago. Pt states the CBG was 95 mg/dl prior to fall. Pt denies dizziness, lightheaded or weakness at this time. Laceration noted to center, anterior forehead, nose and above left  eyebrow.

## 2023-12-26 NOTE — ED Triage Notes (Signed)
 Patient comes in POV due to a fall this morning where he hit his head. Patient states he was feeling dizzy and weak, but before he could reach the chair to sit down he fell down. Patient reports no LOC. Patient does have a dressing on forehead at this time.

## 2023-12-26 NOTE — ED Provider Notes (Signed)
 [place] EMERGENCY DEPARTMENT AT [facility] Provider Note   CSN: [id number] Arrival date & time: 12/26/23  1042     Patient presents with: Brandon Weaver JULIANNA Mardy is a 88 y.o. male presented to ED with episode of near syncope.  The patient reports that he was walking earlier today and became very lightheaded.  He tried to sit down but he fell over and struck his head on the ground.  There is no loss of consciousness.  He denies any chest pain or difficulty breathing or any recent illness symptoms.  He denies headache.  He reports he is having pain in his head and upper neck.  He denies blood thinner use.  He is here with his wife at bedside.  Patient reports he had a similar episode several years in the past.  He says day-to-day he does not have lightheadedness with walking.   HPI     Prior to Admission medications   Medication Sig Start Date End Date Taking? Authorizing Provider  albuterol  (VENTOLIN  HFA) 108 (90 Base) MCG/ACT inhaler Inhale 2 puffs into the lungs every 4 (four) hours as needed for wheezing or shortness of breath. 05/05/23   Rodriguez-Southworth, Sylvia, PA-C  amLODipine  (NORVASC ) 5 MG tablet Take 5 mg by mouth daily. 12/17/18   [provider]  carboxymethylcellulose (REFRESH PLUS) 0.5 % SOLN Place 1-2 drops into both eyes 3 (three) times daily as needed (dry/irritated eyes.).    [provider]  Cholecalciferol (VITAMIN D3) 1.25 MG (50000 UT) CAPS Take 1 capsule by mouth once a week. 05/16/19   [provider]  cyclobenzaprine (FLEXERIL) 10 MG tablet Take 10 mg by mouth 3 (three) times daily as needed for muscle spasms.  07/05/19   [provider]  diclofenac  Sodium (VOLTAREN ) 1 % GEL Apply 2 g topically 4 (four) times daily. 09/06/19   Couture, Cortni S, PA-C  FARXIGA 10 MG TABS tablet Take 10 mg by mouth daily. 06/14/21   [provider]  fluticasone  (FLONASE ) 50 MCG/ACT nasal spray Place 1 spray into both nostrils 2  (two) times daily. 05/28/22   Pearl Jenkins Lesches, NP  HUMULIN 70/30 (70-30) 100 UNIT/ML injection Inject 45 Units into the skin 2 (two) times daily. 01/24/19   [provider]  irbesartan  (AVAPRO ) 300 MG tablet Take 300 mg by mouth daily.    [provider]  irbesartan  (AVAPRO ) 75 MG tablet Take 75 mg by mouth daily. 02/18/23   [provider]  lubiprostone (AMITIZA) 24 MCG capsule Take 24 mcg by mouth 2 (two) times daily. 01/09/23   [provider]  metoprolol succinate (TOPROL-XL) 25 MG 24 hr tablet Take 25 mg by mouth daily. 06/14/21   [provider]  montelukast  (SINGULAIR ) 10 MG tablet Take 1 tablet (10 mg total) by mouth at bedtime. 05/28/22   Pearl Jenkins Lesches, NP  [facility] ULTRA test strip 1 each by Other route in the morning and at bedtime. 07/15/19   [provider]  oxyCODONE -acetaminophen  (PERCOCET/ROXICET) 5-325 MG tablet Take 1 tablet by mouth daily as needed. 07/01/23   [provider]  pravastatin (PRAVACHOL) 10 MG tablet Take 10 mg by mouth at bedtime. 07/08/21   [provider]  tamsulosin  (FLOMAX ) 0.4 MG CAPS capsule Take 0.4 mg by mouth at bedtime. 12/19/22   [provider]    Allergies: Laxative [bisacodyl]    Review of Systems  Updated Vital Signs BP 129/73   Pulse 68   Temp  98 F (36.7 C) (Oral)   Resp 18   Ht 5' 10 (1.778 m)   Wt 80.3 kg   SpO2 100%   BMI 25.40 kg/m   Physical Exam Constitutional:      General: He is not in acute distress. HENT:     Head: Normocephalic.     Comments: 3 cm laceration above the left eyebrow Eyes:     Conjunctiva/sclera: Conjunctivae normal.     Pupils: Pupils are equal, round, and reactive to light.  Cardiovascular:     Rate and Rhythm: Normal rate and regular rhythm.  Pulmonary:     Effort: Pulmonary effort is normal. No respiratory distress.  Abdominal:     General: There is no distension.     Tenderness: There is no abdominal tenderness.   Musculoskeletal:     Comments: Full range of motion of the extremities, no significant tenderness or deformity of the hips, pelvis, lower extremities.  No tenderness along the clavicle  Skin:    General: Skin is warm and dry.  Neurological:     General: No focal deficit present.     Mental Status: He is alert. Mental status is at baseline.  Psychiatric:        Mood and Affect: Mood normal.        Behavior: Behavior normal.     (all labs ordered are listed, but only abnormal results are displayed) Labs Reviewed  BASIC METABOLIC PANEL WITH GFR - Abnormal; Notable for the following components:      Result Value   BUN 48 (*)    Creatinine, Ser 2.29 (*)    GFR, Estimated 27 (*)    All other components within normal limits  CBC - Abnormal; Notable for the following components:   WBC 3.2 (*)    All other components within normal limits  CBG MONITORING, ED  TROPONIN I (HIGH SENSITIVITY)    EKG: EKG Interpretation Date/Time:  Saturday December 26 2023 11:57:47 EDT Ventricular Rate:  68 PR Interval:  187 QRS Duration:  137 QT Interval:  399 QTC Calculation: 425 R Axis:   -64  Text Interpretation: Sinus rhythm Multiple ventricular premature complexes RBBB and LAFB Confirmed by Cottie Cough ([id number]) on 12/26/2023 12:08:11 PM  Radiology: CT Cervical Spine Wo Contrast Result Date: 12/26/2023 CLINICAL DATA:  Fall.  Blunt trauma to neck and head. EXAM: CT CERVICAL SPINE WITHOUT CONTRAST TECHNIQUE: Multidetector CT imaging of the cervical spine was performed without intravenous contrast. Multiplanar CT image reconstructions were also generated. RADIATION DOSE REDUCTION: This exam was performed according to the departmental dose-optimization program which includes automated exposure control, adjustment of the mA and/or kV according to patient size and/or use of iterative reconstruction technique. COMPARISON:  01/28/2023 FINDINGS: Alignment: Normal.  Stable mild dextroscoliosis. Skull base  and vertebrae: No acute fracture. No primary bone lesion or focal pathologic process. Soft tissues and spinal canal: No prevertebral fluid or swelling. No visible canal hematoma. Disc levels: Prior anterior cervical disc fusion again seen at C3-4. Moderate to severe degenerative disc disease is seen at other cervical levels. Mild bilateral facet DJD is also seen bilaterally. Upper chest: No acute findings. Other: None. IMPRESSION: No acute findings. Prior anterior cervical disc fusion at C3-4. Moderate to severe degenerative disc disease and mild facet DJD. Electronically Signed   By: Norleen DELENA Kil M.D.   On: 12/26/2023 12:46   CT Head Wo Contrast Result Date: 12/26/2023 CLINICAL DATA:  Blunt head trauma. EXAM: CT HEAD WITHOUT CONTRAST TECHNIQUE: Contiguous  axial images were obtained from the base of the skull through the vertex without intravenous contrast. RADIATION DOSE REDUCTION: This exam was performed according to the departmental dose-optimization program which includes automated exposure control, adjustment of the mA and/or kV according to patient size and/or use of iterative reconstruction technique. COMPARISON:  01/28/2023 FINDINGS: Brain: No evidence of intracranial hemorrhage, acute infarction, hydrocephalus, extra-axial collection, or mass lesion/mass effect. Mild chronic small vessel disease again noted. Vascular:  No hyperdense vessel or other acute findings. Skull: No evidence of fracture or other significant bone abnormality. Sinuses/Orbits:  No acute findings. Other: None. IMPRESSION: No acute intracranial abnormality. Mild chronic small vessel disease. Electronically Signed   By: Norleen DELENA Kil M.D.   On: 12/26/2023 12:42   DG Chest Portable 1 View Result Date: 12/26/2023 EXAM: 1 VIEW XRAY OF THE CHEST 12/26/2023 12:04:00 PM COMPARISON: None available. CLINICAL HISTORY: Near syncope. Reason for exam: fall; near syncope. FINDINGS: LUNGS AND PLEURA: No focal pulmonary opacity. No pulmonary edema.  No pleural effusion. No pneumothorax. HEART AND MEDIASTINUM: No acute abnormality of the cardiac and mediastinal silhouettes. BONES AND SOFT TISSUES: Mild upper thoracic dextroscoliosis without evidence of segmentation anomaly. No acute osseous abnormality. IMPRESSION: 1. No acute process Electronically signed by: Katheleen Faes MD 12/26/2023 12:19 PM EDT RP Workstation: [id number]     .Laceration Repair  Date/Time: 12/26/2023 2:41 PM  Performed by: Cottie Donnice PARAS, MD Authorized by: Cottie Donnice PARAS, MD   Consent:    Consent obtained:  Verbal   Consent given by:  Patient   Risks discussed:  Pain, poor cosmetic result, infection and poor wound healing Universal protocol:    Procedure explained and questions answered to patient or proxy's satisfaction: yes     Relevant documents present and verified: yes     Test results available: yes     Imaging studies available: yes     Required blood products, implants, devices, and special equipment available: yes     Site/side marked: yes     Immediately prior to procedure, a time out was called: yes     Patient identity confirmed:  Arm band Anesthesia:    Anesthesia method:  Local infiltration and topical application   Topical anesthetic:  LET   Local anesthetic:  Lidocaine  1% WITH epi Laceration details:    Location:  Scalp   Scalp location:  Frontal   Length (cm):  3 Pre-procedure details:    Preparation:  Patient was prepped and draped in usual sterile fashion and imaging obtained to evaluate for foreign bodies Exploration:    Imaging outcome: foreign body not noted     Wound exploration: wound explored through full range of motion and entire depth of wound visualized     Contaminated: no   Treatment:    Area cleansed with:  Saline   Amount of cleaning:  Standard Skin repair:    Repair method:  Sutures   Suture size:  4-0   Suture material:  Prolene   Suture technique:  Simple interrupted   Number of sutures:  7 Approximation:     Approximation:  Close Repair type:    Repair type:  Simple Post-procedure details:    Dressing:  Open (no dressing)   Procedure completion:  Tolerated well, no immediate complications    Medications Ordered in the ED  lidocaine -EPINEPHrine -tetracaine  (LET) topical gel (has no administration in time range)  lidocaine -EPINEPHrine  (XYLOCAINE  W/EPI) 2 %-1:200000 (PF) injection 10 mL (has no administration in time range)  Medical Decision Making Amount and/or Complexity of Data Reviewed Labs: ordered. Radiology: ordered.  Risk Prescription drug management.   This patient presents to the ED with concern for near syncope. This involves an extensive number of treatment options, and is a complaint that carries with it a high risk of complications and morbidity.  The differential diagnosis includes vasovagal episode versus hypoglycemia versus anemia versus arrhythmia versus orthostatic hypotension versus other  Co-morbidities that complicate the patient evaluation: History of diabetes at high risk of metabolic derangement  Additional history obtained from the patient's wife at bedside  I ordered and personally interpreted labs.  The pertinent results include: No emergent findings, labs at baseline level  I ordered imaging studies including x-ray of the chest, CT head and cervical spine I independently visualized and interpreted imaging which showed no emergent finding I agree with the radiologist interpretation  The patient was maintained on a cardiac monitor.  I personally viewed and interpreted the cardiac monitored which showed an underlying rhythm of: Regular heart rate  Per my interpretation the patient's ECG shows no acute ischemic findings, no significant changes from prior tracing  I ordered medication including topical and local lidocaine  for laceration repair  I have reviewed the patients home medicines and have made adjustments as  needed  Test Considered: Lower suspicion for acute pulmonary embolism.  No indication for angiogram at this time.  Doubt aortic dissection, sepsis, meningitis, or other life-threatening condition  After the interventions noted above, I reevaluated the patient and found that they have: improved  Patient given instructions to have sutures removed in 5 to 7 days.  This can happen as an outpatient.  Also advised that he apply ice 10 minutes at a time on and off to the hematoma on his forehead.  He verbalized understanding  Disposition:  After consideration of the diagnostic results and the patients response to treatment, I feel that the patent would benefit from discharge with referral to cardiology for outpatient evaluation for near syncope.  The patient has not had an echocardiogram on record, no recent carotid ultrasound, and this may be a reasonable workup as an outpatient.  Overall he denies to me that he has persistent exertional symptoms, but does report he has noted about 4 weeks of chest pain mostly when he is lying down at rest at night.  This is atypical for ACS.  But I think a cardiology referral for near syncope would be reasonable.      Final diagnoses:  Fall, initial encounter  Laceration of forehead, initial encounter  Traumatic hematoma of forehead, initial encounter  Near syncope    ED Discharge Orders          Ordered    Ambulatory referral to Cardiology        12/26/23 1407               Cottie Donnice PARAS, MD 12/26/23 1443

## 2023-12-26 NOTE — Discharge Instructions (Addendum)
 Your stitches need to be removed in 5-7 days.  Keep them dry for the next 2 days.  Then you can shower normally.  You can remove the stitches at your doctor's office or an urgent care - you do not need to come back to the ER for this.  Follow up with cardiology for your dizziness.

## 2024-01-01 DIAGNOSIS — S0990XD Unspecified injury of head, subsequent encounter: Secondary | ICD-10-CM | POA: Diagnosis not present

## 2024-01-01 DIAGNOSIS — R55 Syncope and collapse: Secondary | ICD-10-CM | POA: Diagnosis not present

## 2024-01-01 DIAGNOSIS — S0191XD Laceration without foreign body of unspecified part of head, subsequent encounter: Secondary | ICD-10-CM | POA: Diagnosis not present

## 2024-01-13 DIAGNOSIS — R55 Syncope and collapse: Secondary | ICD-10-CM | POA: Diagnosis not present

## 2024-01-15 DIAGNOSIS — R55 Syncope and collapse: Secondary | ICD-10-CM | POA: Diagnosis not present

## 2024-01-18 ENCOUNTER — Ambulatory Visit: Attending: Physician Assistant | Admitting: Physician Assistant

## 2024-01-18 VITALS — BP 118/62 | HR 87 | Ht 70.0 in | Wt 176.8 lb

## 2024-01-18 DIAGNOSIS — R55 Syncope and collapse: Secondary | ICD-10-CM | POA: Diagnosis not present

## 2024-01-18 NOTE — Patient Instructions (Signed)
 Medication Instructions:  Your physician recommends that you continue on your current medications as directed. Please refer to the Current Medication list given to you today.  *If you need a refill on your cardiac medications before your next appointment, please call your pharmacy*  Lab Work: TSH today  Testing/Procedures: Your physician has requested that you have a carotid duplex. This test is an ultrasound of the carotid arteries in your neck. It looks at blood flow through these arteries that supply the brain with blood. Allow one hour for this exam. There are no restrictions or special instructions.   Your physician has requested that you have an echocardiogram. Echocardiography is a painless test that uses sound waves to create images of your heart. It provides your doctor with information about the size and shape of your heart and how well your heart's chambers and valves are working. This procedure takes approximately one hour. There are no restrictions for this procedure. Please do NOT wear cologne, perfume, aftershave, or lotions (deodorant is allowed). Please arrive 15 minutes prior to your appointment time.  Please note: We ask at that you not bring children with you during ultrasound (echo/ vascular) testing. Due to room size and safety concerns, children are not allowed in the ultrasound rooms during exams. Our front office staff cannot provide observation of children in our lobby area while testing is being conducted. An adult accompanying a patient to their appointment will only be allowed in the ultrasound room at the discretion of the ultrasound technician under special circumstances. We apologize for any inconvenience.  Follow-Up: At [facility], you and your health needs are our priority.  As part of our continuing mission to provide you with exceptional heart care, our providers are all part of one team.  This team includes your primary Cardiologist (physician) and  Advanced Practice Providers or APPs (Physician Assistants and Nurse Practitioners) who all work together to provide you with the care you need, when you need it.  Your next appointment:   6 week(s)  Provider:   Scot Ford, PA-C          We recommend signing up for the patient portal called MyChart.  Sign up information is provided on this After Visit Summary.  MyChart is used to connect with patients for Virtual Visits (Telemedicine).  Patients are able to view lab/test results, encounter notes, upcoming appointments, etc.  Non-urgent messages can be sent to your provider as well.   To learn more about what you can do with MyChart, go to [email or web address].

## 2024-01-18 NOTE — Progress Notes (Unsigned)
 Cardiology Office Note   Date:  01/20/2024  ID:  Brandon Weaver, DOB February 20, 1936, MRN [id number] PCP: Rexanne Ingle, MD  [facility] Providers Cardiologist:  [facility] - DOD Dr. Jeffrie  History of Present Illness Brandon Weaver is a 88 y.o. male with past medical history of CKD stage III, hypertension, DM2, and sleep apnea intolerant of CPAP.  Patient was recently seen in the emergency room on 12/26/2023 for evaluation of near syncope.  He was walking and became very lightheaded.  When he attempted to sit down, he fell over and struck his head on the ground.  There was no loss of consciousness.  He also reported similar episode several years in the past.  Chest x-ray was normal.  Blood glucose level was 90.  CT of the head showed no acute abnormality, mild chronic small vessel changes.  CT of the cervical neck showed prior anterior cervical disc fusion at C3-4, moderate to severe degenerative disc disease, but no acute finding.  Basic metabolic panel showed creatinine of 2.29.  Troponin was negative.  EKG showed normal sinus rhythm, no acute finding.  He underwent a laceration repair in the emergency room and discharged to follow-up with cardiology service.  [facility] for evaluation of presyncope accompanied by wife.  This is his third episode.  He has not taken any blood pressure medication for the past 1.5 years as his blood pressure normalized even without the blood pressure medication due to significant weight loss.  He is not on any diabetes medication.  He is taking pravastatin.  First 2 episodes of presyncope happened last year and also at the beginning of this year.  During both episodes, he was urinating in the bathroom when he had sudden onset of weakness that make him fell to his feet.  He never completely passed out.  During the most recent episode, he was walking around at home when he had near passing out spell again.  He described the  symptom as sudden onset, no chest pain, shortness of breath and then not preceded by any body position changes.  He usually can get up in the morning and do his usual activity without having to sit on the side of the bed for few minutes.  He denies any prior history of orthostatic dizziness.  Since he left the ED, Dr. Rexanne, his PCP has ordered a 1 week heart monitor which reportedly was normal.  We will request the results of the heart monitor.  He will need echocardiogram and carotid ultrasound.  I will also obtain TSH to make sure his thyroid  is normal.  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  Therefore he has high probability of developing conduction disease in the future.  Just because he had a normal heart monitor, we cannot completely rule out the possibility of advanced heart block or pauses contributing to his passing out spell.  If symptom recurs despite having recent normal heart monitor, I likely will refer the patient for consideration of loop recorder.  ROS:   Patient described 3 episodes of near syncope.  He denies any chest pain or shortness of breath.  Studies Reviewed          Risk Assessment/Calculations          Physical Exam VS:  BP 118/62   Pulse 87   Ht 5' 10 (1.778 m)   Wt 176 lb 12.8 oz (80.2 kg)  SpO2 99%   BMI 25.37 kg/m        Wt Readings from Last 3 Encounters:  01/18/24 176 lb 12.8 oz (80.2 kg)  12/26/23 177 lb (80.3 kg)  01/28/23 199 lb (90.3 kg)    GEN: Well nourished, well developed in no acute distress NECK: No JVD; No carotid bruits CARDIAC: RRR, no murmurs, rubs, gallops RESPIRATORY:  Clear to auscultation without rales, wheezing or rhonchi  ABDOMEN: Soft, non-tender, non-distended EXTREMITIES:  No edema; No deformity   ASSESSMENT AND PLAN  Presyncope: So far patient had 3 episodes of presyncope.  According to the patient, Dr. Rexanne his PCP has recently ordered a 1 week heart monitor and it was  reportedly normal.  I will try to obtain the result of this heart monitor.  I will order echocardiogram and carotid ultrasound.  If he does have recurrent syncope again, I would have a very low threshold of considering a loop recorder especially in light of right bundle branch block and left anterior fascicular block which suggest of underlying conduction disorder.       Dispo: Follow-up in 6 weeks.  Signed, Scot Ford, PA

## 2024-01-19 LAB — TSH: TSH: 2.27 u[IU]/mL (ref 0.450–4.500)

## 2024-01-20 ENCOUNTER — Ambulatory Visit: Payer: Self-pay | Admitting: Physician Assistant

## 2024-01-20 NOTE — Progress Notes (Signed)
 Normal thyroid level

## 2024-01-21 ENCOUNTER — Telehealth: Payer: Self-pay | Admitting: Physician Assistant

## 2024-01-21 NOTE — Telephone Encounter (Signed)
 Received heart monitor report from Dr. Christiane office.  Heart monitor has been personally reviewed.  Average heart rate was in the 60s, PAC burden 1.6%, PVC burden less than 0.1%.  He did not have any significant tachycardia or bradycardia.  There was no pauses.  He had no arrhythmia to explain this presyncope.  Talking with the patient, he did not have symptom that week he wore the heart monitor.  His symptom occurs once every several months.  If he has recurrent symptoms in the future, I have encouraged him to contact cardiology service and we may have to consider a loop recorder.  Otherwise, his recent heart monitor was reassuring.  I have called him and explained the results.  Patient appreciated the phone call.

## 2024-02-01 DIAGNOSIS — N184 Chronic kidney disease, stage 4 (severe): Secondary | ICD-10-CM | POA: Diagnosis not present

## 2024-02-01 DIAGNOSIS — I10 Essential (primary) hypertension: Secondary | ICD-10-CM | POA: Diagnosis not present

## 2024-02-11 DIAGNOSIS — I129 Hypertensive chronic kidney disease with stage 1 through stage 4 chronic kidney disease, or unspecified chronic kidney disease: Secondary | ICD-10-CM | POA: Diagnosis not present

## 2024-02-11 DIAGNOSIS — N1832 Chronic kidney disease, stage 3b: Secondary | ICD-10-CM | POA: Diagnosis not present

## 2024-02-11 DIAGNOSIS — E1122 Type 2 diabetes mellitus with diabetic chronic kidney disease: Secondary | ICD-10-CM | POA: Diagnosis not present

## 2024-02-11 DIAGNOSIS — E875 Hyperkalemia: Secondary | ICD-10-CM | POA: Diagnosis not present

## 2024-02-11 DIAGNOSIS — R801 Persistent proteinuria, unspecified: Secondary | ICD-10-CM | POA: Diagnosis not present

## 2024-02-11 DIAGNOSIS — E559 Vitamin D deficiency, unspecified: Secondary | ICD-10-CM | POA: Diagnosis not present

## 2024-02-11 DIAGNOSIS — E872 Acidosis, unspecified: Secondary | ICD-10-CM | POA: Diagnosis not present

## 2024-03-04 ENCOUNTER — Ambulatory Visit (HOSPITAL_BASED_OUTPATIENT_CLINIC_OR_DEPARTMENT_OTHER)
Admission: RE | Admit: 2024-03-04 | Discharge: 2024-03-04 | Disposition: A | Discharge status: Home / Self Care | Source: Ambulatory Visit | Attending: Physician Assistant | Admitting: Physician Assistant

## 2024-03-04 ENCOUNTER — Ambulatory Visit (HOSPITAL_COMMUNITY)
Admission: RE | Admit: 2024-03-04 | Discharge: 2024-03-04 | Disposition: A | Discharge status: Home / Self Care | Source: Ambulatory Visit | Attending: Physician Assistant | Admitting: Physician Assistant

## 2024-03-04 ENCOUNTER — Other Ambulatory Visit: Payer: Self-pay | Admitting: Physician Assistant

## 2024-03-04 DIAGNOSIS — R55 Syncope and collapse: Secondary | ICD-10-CM

## 2024-03-08 ENCOUNTER — Ambulatory Visit: Attending: Physician Assistant | Admitting: Physician Assistant

## 2024-03-08 ENCOUNTER — Ambulatory Visit: Payer: Self-pay | Admitting: Physician Assistant

## 2024-03-08 VITALS — BP 138/80 | HR 83 | Wt 177.6 lb

## 2024-03-08 DIAGNOSIS — E119 Type 2 diabetes mellitus without complications: Secondary | ICD-10-CM

## 2024-03-08 DIAGNOSIS — I4510 Unspecified right bundle-branch block: Secondary | ICD-10-CM

## 2024-03-08 DIAGNOSIS — R55 Syncope and collapse: Secondary | ICD-10-CM

## 2024-03-08 DIAGNOSIS — I10 Essential (primary) hypertension: Secondary | ICD-10-CM

## 2024-03-08 NOTE — Progress Notes (Unsigned)
 Cardiology Office Note   Date:  03/08/2024  ID:  Brandon Weaver, DOB July 11, 1935, MRN [id number] PCP: Rexanne Ingle, MD  [facility] Providers Cardiologist:  None { Click to update primary MD,subspecialty MD or APP then REFRESH:1}    History of Present Illness Brandon Weaver is a 88 y.o. male with past medical history of CKD stage III, hypertension, DM2, and sleep apnea intolerant of CPAP. Patient was recently seen in the emergency room on 12/26/2023 for evaluation of near syncope. He was walking and became very lightheaded. When he attempted to sit down, he fell over and struck his head on the ground. There was no loss of consciousness. He also reported similar episode several years in the past. Chest x-ray was normal. Blood glucose level was 90. CT of the head showed no acute abnormality, mild chronic small vessel changes. CT of the cervical neck showed prior anterior cervical disc fusion at C3-4, moderate to severe degenerative disc disease, but no acute finding. Basic metabolic panel showed creatinine of 2.29. Troponin was negative. EKG showed normal sinus rhythm, no acute finding. He underwent a laceration repair in the emergency room and discharged to follow-up with cardiology service.   I last [facility] the [facility] on 01/18/2024 for evaluation of presyncope.  That was his third episode.  Patient mention to me at that time that he has not been taking any blood pressure medication for the past 1.5 years as his blood pressure normalized even without the blood pressure medication due to significant weight loss.  He was not on any diabetes medication either.  He was taking pravastatin.  The first 2 episode happened last year and also at the beginning of this year.  During both episodes, he was urinating in the bathroom when he had a sudden onset of weakness that make him fell to his knee.  He never completely passed out during the most recent episode, he was walking around  at home when he had had a near passing out spell again.  Dr. Rexanne, he his PCP ordered a 1 week heart monitor that was reportedly normal.  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  I ordered echocardiogram and a carotid ultrasound.  Carotid ultrasound came back reassuring showing only mild disease.  Unfortunately echocardiogram was inconclusive due to body habitus, cardiac MRI was recommended.  I had low threshold to consider loop recorder in the future if symptom recurs.   Patient presents today for follow-up.  He attributed his presyncope to occasional dip in the blood pressure.  Unfortunately, his echocardiogram was inconclusive, I will obtain a cardiac MRI.  I recommended continue to observe for dizzy spell.  He has not had any dizzy spells since the last visit.  I still think he is at risk for more advanced conduction disorder given right bundle branch block and the left anterior fascicular block seen on the previous EKG.  I recommended continue observation.  If cardiac MRI appears to be normal, he can follow-up with with cardiology service in 1 year.  ROS: ***  Studies Reviewed      *** Risk Assessment/Calculations {Does this patient have ATRIAL FIBRILLATION?[phone]}         Physical Exam VS:  BP 138/80   Pulse 83   Wt 177 lb 9.6 oz (80.6 kg)   SpO2 97%   BMI 25.48 kg/m        Wt Readings from Last 3 Encounters:  03/08/24 177 lb 9.6 oz (80.6 kg)  01/18/24 176 lb 12.8 oz (80.2 kg)  12/26/23 177 lb (80.3 kg)    GEN: Well nourished, well developed in no acute distress NECK: No JVD; No carotid bruits CARDIAC: ***RRR, no murmurs, rubs, gallops RESPIRATORY:  Clear to auscultation without rales, wheezing or rhonchi  ABDOMEN: Soft, non-tender, non-distended EXTREMITIES:  No edema; No deformity   ASSESSMENT AND PLAN ***    {Are you ordering a CV Procedure (e.g. stress test, cath, DCCV, TEE, etc)?   Press F2        :[id number]}  Dispo:  ***  Signed, Scot Ford, PA

## 2024-03-08 NOTE — Patient Instructions (Signed)
 Medication Instructions:  Your physician recommends that you continue on your current medications as directed. Please refer to the Current Medication list given to you today.  *If you need a refill on your cardiac medications before your next appointment, please call your pharmacy*  Lab Work: NONE ordered at this time of appointment   Testing/Procedures: Your physician has requested that you have a cardiac MRI. Cardiac MRI uses a computer to create images of your heart as its beating, producing both still and moving pictures of your heart and major blood vessels. For further information please visit [email or web address]. Please follow the instruction sheet given to you today for more information.   Follow-Up: At [facility], you and your health needs are our priority.  As part of our continuing mission to provide you with exceptional heart care, our providers are all part of one team.  This team includes your primary Cardiologist (physician) and Advanced Practice Providers or APPs (Physician Assistants and Nurse Practitioners) who all work together to provide you with the care you need, when you need it.  Your next appointment:   1 year(s)  Provider:   Dr. Jeffrie    We recommend signing up for the patient portal called MyChart.  Sign up information is provided on this After Visit Summary.  MyChart is used to connect with patients for Virtual Visits (Telemedicine).  Patients are able to view lab/test results, encounter notes, upcoming appointments, etc.  Non-urgent messages can be sent to your provider as well.   To learn more about what you can do with MyChart, go to [email or web address].

## 2024-03-16 ENCOUNTER — Other Ambulatory Visit: Payer: Self-pay | Admitting: Physician Assistant

## 2024-03-16 ENCOUNTER — Ambulatory Visit (HOSPITAL_COMMUNITY)
Admission: RE | Admit: 2024-03-16 | Discharge: 2024-03-16 | Disposition: A | Discharge status: Home / Self Care | Source: Ambulatory Visit | Attending: Physician Assistant | Admitting: Physician Assistant

## 2024-03-16 DIAGNOSIS — I4510 Unspecified right bundle-branch block: Secondary | ICD-10-CM | POA: Diagnosis present

## 2024-03-16 DIAGNOSIS — R55 Syncope and collapse: Secondary | ICD-10-CM

## 2024-03-16 MED ORDER — GADOBUTROL 1 MMOL/ML IV SOLN
10.0000 mL | Freq: Once | INTRAVENOUS | Status: AC | PRN
  Administered 2024-03-16: 10 mL via INTRAVENOUS

## 2024-03-21 ENCOUNTER — Ambulatory Visit: Payer: Self-pay | Admitting: Physician Assistant

## 2024-03-21 NOTE — Progress Notes (Signed)
 See separate cardiac MRI report

## 2024-03-22 NOTE — Telephone Encounter (Signed)
 Spoke with pt regarding test results. PT stated he had the MRI on 11/12 and hae already received a call with the results. Pt was advised to call our office if he had any further concerns.
